# Patient Record
Sex: Female | Born: 1940 | Race: Black or African American | Hispanic: No | Smoking: Current every day smoker
Health system: Southern US, Community
[De-identification: ages and names within clinical notes are randomized; demographics above are authoritative.]

## PROBLEM LIST (undated history)

## (undated) DIAGNOSIS — Z789 Other specified health status: Secondary | ICD-10-CM

## (undated) DIAGNOSIS — D496 Neoplasm of unspecified behavior of brain: Secondary | ICD-10-CM

## (undated) DIAGNOSIS — I10 Essential (primary) hypertension: Secondary | ICD-10-CM

## (undated) DIAGNOSIS — C801 Malignant (primary) neoplasm, unspecified: Secondary | ICD-10-CM

## (undated) DIAGNOSIS — D249 Benign neoplasm of unspecified breast: Secondary | ICD-10-CM

## (undated) HISTORY — DX: Benign neoplasm of unspecified breast: D24.9

## (undated) HISTORY — DX: Essential (primary) hypertension: I10

---

## 2004-05-23 HISTORY — PX: COLONOSCOPY: SHX174

## 2004-05-23 HISTORY — PX: POLYPECTOMY: SHX149

## 2005-02-10 ENCOUNTER — Ambulatory Visit: Payer: Self-pay | Admitting: Internal Medicine

## 2005-04-12 ENCOUNTER — Ambulatory Visit: Payer: Self-pay | Admitting: Internal Medicine

## 2005-04-25 ENCOUNTER — Ambulatory Visit: Payer: Self-pay | Admitting: Internal Medicine

## 2005-08-16 ENCOUNTER — Ambulatory Visit: Payer: Self-pay | Admitting: Internal Medicine

## 2007-05-24 DIAGNOSIS — D249 Benign neoplasm of unspecified breast: Secondary | ICD-10-CM

## 2007-05-24 HISTORY — DX: Benign neoplasm of unspecified breast: D24.9

## 2007-05-24 HISTORY — PX: BREAST BIOPSY: SHX20

## 2007-07-13 ENCOUNTER — Ambulatory Visit: Payer: Self-pay | Admitting: Internal Medicine

## 2007-07-17 ENCOUNTER — Ambulatory Visit: Payer: Self-pay | Admitting: Internal Medicine

## 2007-12-28 ENCOUNTER — Ambulatory Visit: Payer: Self-pay | Admitting: General Surgery

## 2008-01-18 ENCOUNTER — Other Ambulatory Visit: Payer: Self-pay

## 2008-01-18 ENCOUNTER — Ambulatory Visit: Payer: Self-pay | Admitting: General Surgery

## 2008-01-22 ENCOUNTER — Ambulatory Visit: Payer: Self-pay | Admitting: General Surgery

## 2008-07-22 ENCOUNTER — Ambulatory Visit: Payer: Self-pay | Admitting: General Surgery

## 2008-09-09 ENCOUNTER — Ambulatory Visit: Payer: Self-pay | Admitting: Internal Medicine

## 2009-01-07 ENCOUNTER — Ambulatory Visit: Payer: Self-pay | Admitting: Internal Medicine

## 2009-01-20 ENCOUNTER — Ambulatory Visit: Payer: Self-pay | Admitting: Otolaryngology

## 2009-07-24 ENCOUNTER — Ambulatory Visit: Payer: Self-pay | Admitting: General Surgery

## 2010-02-17 ENCOUNTER — Ambulatory Visit: Payer: Self-pay | Admitting: Internal Medicine

## 2010-07-26 ENCOUNTER — Ambulatory Visit: Payer: Self-pay | Admitting: General Surgery

## 2011-08-09 ENCOUNTER — Ambulatory Visit: Payer: Self-pay | Admitting: General Surgery

## 2011-08-10 ENCOUNTER — Ambulatory Visit: Payer: Self-pay | Admitting: General Surgery

## 2011-09-05 ENCOUNTER — Ambulatory Visit: Payer: Self-pay | Admitting: General Surgery

## 2011-09-07 LAB — PATHOLOGY REPORT

## 2012-03-14 ENCOUNTER — Ambulatory Visit: Payer: Self-pay | Admitting: General Surgery

## 2012-07-13 ENCOUNTER — Encounter: Payer: Self-pay | Admitting: *Deleted

## 2012-08-17 ENCOUNTER — Ambulatory Visit: Payer: Self-pay | Admitting: Internal Medicine

## 2012-09-13 ENCOUNTER — Ambulatory Visit: Payer: Self-pay | Admitting: General Surgery

## 2012-09-19 ENCOUNTER — Encounter: Payer: Self-pay | Admitting: General Surgery

## 2012-10-01 ENCOUNTER — Ambulatory Visit: Payer: Self-pay | Admitting: General Surgery

## 2012-11-06 ENCOUNTER — Ambulatory Visit: Payer: Self-pay | Admitting: General Surgery

## 2012-12-11 ENCOUNTER — Encounter: Payer: Self-pay | Admitting: *Deleted

## 2013-07-22 ENCOUNTER — Telehealth: Payer: Self-pay | Admitting: General Surgery

## 2013-07-22 NOTE — Telephone Encounter (Signed)
07-22-13 BETH WITH DR Lowrys CALLED TO [facility] APPT FOR PT.SHE HAD HER MAMMO DONE @ [place] [facility] ON 09-13-12 AND WAS A NO SHOW FOR APPTS HERE WITH YOU ON 10-01-12 & 11-06-12. SHE WOULD LIKE TO KNOW DID WE WANT TO [place] HER MAMMO & FOLLOW UP APPT WITH YOU OR THEM TO [facility] MAMMO AND THEN MAKE APPT HERE.WHICH DO WOULD YOU LIKE TO DO?

## 2013-07-23 NOTE — Telephone Encounter (Signed)
07-23-13 I RELAYED [place] OFC/MTH

## 2013-07-23 NOTE — Telephone Encounter (Signed)
They can schedule mammogram and if abnormal will see her back here. Otherwise she can follow with Dr. Clayborn Bigness yearly

## 2014-03-24 ENCOUNTER — Encounter: Payer: Self-pay | Admitting: *Deleted

## 2014-05-26 DIAGNOSIS — Z1231 Encounter for screening mammogram for malignant neoplasm of breast: Secondary | ICD-10-CM | POA: Diagnosis not present

## 2014-06-11 DIAGNOSIS — H2513 Age-related nuclear cataract, bilateral: Secondary | ICD-10-CM | POA: Diagnosis not present

## 2014-07-15 DIAGNOSIS — J439 Emphysema, unspecified: Secondary | ICD-10-CM | POA: Diagnosis not present

## 2014-07-15 DIAGNOSIS — F17210 Nicotine dependence, cigarettes, uncomplicated: Secondary | ICD-10-CM | POA: Diagnosis not present

## 2014-07-15 DIAGNOSIS — R7301 Impaired fasting glucose: Secondary | ICD-10-CM | POA: Diagnosis not present

## 2014-07-15 DIAGNOSIS — I6523 Occlusion and stenosis of bilateral carotid arteries: Secondary | ICD-10-CM | POA: Diagnosis not present

## 2014-07-15 DIAGNOSIS — F411 Generalized anxiety disorder: Secondary | ICD-10-CM | POA: Diagnosis not present

## 2014-07-15 DIAGNOSIS — I10 Essential (primary) hypertension: Secondary | ICD-10-CM | POA: Diagnosis not present

## 2014-10-10 DIAGNOSIS — E782 Mixed hyperlipidemia: Secondary | ICD-10-CM | POA: Diagnosis not present

## 2014-10-10 DIAGNOSIS — R7301 Impaired fasting glucose: Secondary | ICD-10-CM | POA: Diagnosis not present

## 2014-10-10 DIAGNOSIS — I10 Essential (primary) hypertension: Secondary | ICD-10-CM | POA: Diagnosis not present

## 2014-10-10 DIAGNOSIS — Z0001 Encounter for general adult medical examination with abnormal findings: Secondary | ICD-10-CM | POA: Diagnosis not present

## 2014-10-14 DIAGNOSIS — J449 Chronic obstructive pulmonary disease, unspecified: Secondary | ICD-10-CM | POA: Diagnosis not present

## 2014-10-14 DIAGNOSIS — F17210 Nicotine dependence, cigarettes, uncomplicated: Secondary | ICD-10-CM | POA: Diagnosis not present

## 2014-10-14 DIAGNOSIS — I10 Essential (primary) hypertension: Secondary | ICD-10-CM | POA: Diagnosis not present

## 2014-10-14 DIAGNOSIS — R7301 Impaired fasting glucose: Secondary | ICD-10-CM | POA: Diagnosis not present

## 2014-10-14 DIAGNOSIS — D751 Secondary polycythemia: Secondary | ICD-10-CM | POA: Diagnosis not present

## 2014-11-19 DIAGNOSIS — N189 Chronic kidney disease, unspecified: Secondary | ICD-10-CM | POA: Diagnosis not present

## 2014-11-19 DIAGNOSIS — D751 Secondary polycythemia: Secondary | ICD-10-CM | POA: Diagnosis not present

## 2014-11-19 DIAGNOSIS — R0602 Shortness of breath: Secondary | ICD-10-CM | POA: Diagnosis not present

## 2014-11-20 DIAGNOSIS — N189 Chronic kidney disease, unspecified: Secondary | ICD-10-CM | POA: Diagnosis not present

## 2014-11-20 DIAGNOSIS — I10 Essential (primary) hypertension: Secondary | ICD-10-CM | POA: Diagnosis not present

## 2014-11-25 DIAGNOSIS — I10 Essential (primary) hypertension: Secondary | ICD-10-CM | POA: Diagnosis not present

## 2014-11-25 DIAGNOSIS — K811 Chronic cholecystitis: Secondary | ICD-10-CM | POA: Diagnosis not present

## 2014-11-25 DIAGNOSIS — N182 Chronic kidney disease, stage 2 (mild): Secondary | ICD-10-CM | POA: Diagnosis not present

## 2014-11-25 DIAGNOSIS — D751 Secondary polycythemia: Secondary | ICD-10-CM | POA: Diagnosis not present

## 2015-02-26 DIAGNOSIS — E782 Mixed hyperlipidemia: Secondary | ICD-10-CM | POA: Diagnosis not present

## 2015-02-26 DIAGNOSIS — F17210 Nicotine dependence, cigarettes, uncomplicated: Secondary | ICD-10-CM | POA: Diagnosis not present

## 2015-02-26 DIAGNOSIS — R7301 Impaired fasting glucose: Secondary | ICD-10-CM | POA: Diagnosis not present

## 2015-02-26 DIAGNOSIS — I10 Essential (primary) hypertension: Secondary | ICD-10-CM | POA: Diagnosis not present

## 2015-02-26 DIAGNOSIS — J449 Chronic obstructive pulmonary disease, unspecified: Secondary | ICD-10-CM | POA: Diagnosis not present

## 2015-05-06 DIAGNOSIS — N182 Chronic kidney disease, stage 2 (mild): Secondary | ICD-10-CM | POA: Diagnosis not present

## 2015-05-06 DIAGNOSIS — Z0001 Encounter for general adult medical examination with abnormal findings: Secondary | ICD-10-CM | POA: Diagnosis not present

## 2015-05-06 DIAGNOSIS — D751 Secondary polycythemia: Secondary | ICD-10-CM | POA: Diagnosis not present

## 2015-05-06 DIAGNOSIS — F330 Major depressive disorder, recurrent, mild: Secondary | ICD-10-CM | POA: Diagnosis not present

## 2015-05-06 DIAGNOSIS — I10 Essential (primary) hypertension: Secondary | ICD-10-CM | POA: Diagnosis not present

## 2015-05-06 DIAGNOSIS — M810 Age-related osteoporosis without current pathological fracture: Secondary | ICD-10-CM | POA: Diagnosis not present

## 2015-05-06 DIAGNOSIS — F17210 Nicotine dependence, cigarettes, uncomplicated: Secondary | ICD-10-CM | POA: Diagnosis not present

## 2015-06-16 DIAGNOSIS — H2513 Age-related nuclear cataract, bilateral: Secondary | ICD-10-CM | POA: Diagnosis not present

## 2015-06-24 DIAGNOSIS — M859 Disorder of bone density and structure, unspecified: Secondary | ICD-10-CM | POA: Diagnosis not present

## 2015-06-24 DIAGNOSIS — E2839 Other primary ovarian failure: Secondary | ICD-10-CM | POA: Diagnosis not present

## 2015-06-24 DIAGNOSIS — M810 Age-related osteoporosis without current pathological fracture: Secondary | ICD-10-CM | POA: Diagnosis not present

## 2015-06-24 DIAGNOSIS — Z1231 Encounter for screening mammogram for malignant neoplasm of breast: Secondary | ICD-10-CM | POA: Diagnosis not present

## 2015-08-07 DIAGNOSIS — E782 Mixed hyperlipidemia: Secondary | ICD-10-CM | POA: Diagnosis not present

## 2015-08-07 DIAGNOSIS — Z0001 Encounter for general adult medical examination with abnormal findings: Secondary | ICD-10-CM | POA: Diagnosis not present

## 2015-08-07 DIAGNOSIS — N182 Chronic kidney disease, stage 2 (mild): Secondary | ICD-10-CM | POA: Diagnosis not present

## 2015-08-07 DIAGNOSIS — R7301 Impaired fasting glucose: Secondary | ICD-10-CM | POA: Diagnosis not present

## 2015-08-07 DIAGNOSIS — J449 Chronic obstructive pulmonary disease, unspecified: Secondary | ICD-10-CM | POA: Diagnosis not present

## 2015-08-07 DIAGNOSIS — I10 Essential (primary) hypertension: Secondary | ICD-10-CM | POA: Diagnosis not present

## 2015-09-02 DIAGNOSIS — I6523 Occlusion and stenosis of bilateral carotid arteries: Secondary | ICD-10-CM | POA: Diagnosis not present

## 2015-11-16 DIAGNOSIS — N182 Chronic kidney disease, stage 2 (mild): Secondary | ICD-10-CM | POA: Diagnosis not present

## 2015-11-16 DIAGNOSIS — R195 Other fecal abnormalities: Secondary | ICD-10-CM | POA: Diagnosis not present

## 2015-11-16 DIAGNOSIS — H04129 Dry eye syndrome of unspecified lacrimal gland: Secondary | ICD-10-CM | POA: Diagnosis not present

## 2015-11-16 DIAGNOSIS — D751 Secondary polycythemia: Secondary | ICD-10-CM | POA: Diagnosis not present

## 2015-11-30 DIAGNOSIS — N182 Chronic kidney disease, stage 2 (mild): Secondary | ICD-10-CM | POA: Diagnosis not present

## 2015-11-30 DIAGNOSIS — H04123 Dry eye syndrome of bilateral lacrimal glands: Secondary | ICD-10-CM | POA: Diagnosis not present

## 2015-11-30 DIAGNOSIS — F17210 Nicotine dependence, cigarettes, uncomplicated: Secondary | ICD-10-CM | POA: Diagnosis not present

## 2015-11-30 DIAGNOSIS — J449 Chronic obstructive pulmonary disease, unspecified: Secondary | ICD-10-CM | POA: Diagnosis not present

## 2015-11-30 DIAGNOSIS — Z0001 Encounter for general adult medical examination with abnormal findings: Secondary | ICD-10-CM | POA: Diagnosis not present

## 2015-11-30 DIAGNOSIS — I10 Essential (primary) hypertension: Secondary | ICD-10-CM | POA: Diagnosis not present

## 2015-11-30 DIAGNOSIS — D751 Secondary polycythemia: Secondary | ICD-10-CM | POA: Diagnosis not present

## 2015-11-30 DIAGNOSIS — R7301 Impaired fasting glucose: Secondary | ICD-10-CM | POA: Diagnosis not present

## 2015-12-07 DIAGNOSIS — R195 Other fecal abnormalities: Secondary | ICD-10-CM | POA: Diagnosis not present

## 2016-01-01 ENCOUNTER — Emergency Department: Payer: Medicare Other

## 2016-01-01 ENCOUNTER — Inpatient Hospital Stay
Admission: AD | Admit: 2016-01-01 | Payer: Self-pay | Source: Other Acute Inpatient Hospital | Admitting: Internal Medicine

## 2016-01-01 ENCOUNTER — Encounter: Payer: Self-pay | Admitting: Radiology

## 2016-01-01 ENCOUNTER — Encounter (HOSPITAL_COMMUNITY): Payer: Self-pay | Admitting: Family Medicine

## 2016-01-01 ENCOUNTER — Inpatient Hospital Stay (HOSPITAL_COMMUNITY)
Admission: AD | Admit: 2016-01-01 | Discharge: 2016-01-09 | DRG: 054 | Disposition: A | Discharge status: Skilled Nursing Facility | Payer: Medicare Other | Source: Other Acute Inpatient Hospital | Attending: Internal Medicine | Admitting: Internal Medicine

## 2016-01-01 ENCOUNTER — Emergency Department
Admission: EM | Admit: 2016-01-01 | Discharge: 2016-01-01 | Payer: Medicare Other | Attending: Emergency Medicine | Admitting: Emergency Medicine

## 2016-01-01 DIAGNOSIS — R918 Other nonspecific abnormal finding of lung field: Secondary | ICD-10-CM | POA: Diagnosis not present

## 2016-01-01 DIAGNOSIS — C3492 Malignant neoplasm of unspecified part of left bronchus or lung: Secondary | ICD-10-CM | POA: Diagnosis not present

## 2016-01-01 DIAGNOSIS — I10 Essential (primary) hypertension: Secondary | ICD-10-CM | POA: Insufficient documentation

## 2016-01-01 DIAGNOSIS — D496 Neoplasm of unspecified behavior of brain: Secondary | ICD-10-CM

## 2016-01-01 DIAGNOSIS — E860 Dehydration: Secondary | ICD-10-CM | POA: Diagnosis not present

## 2016-01-01 DIAGNOSIS — C7931 Secondary malignant neoplasm of brain: Principal | ICD-10-CM | POA: Diagnosis present

## 2016-01-01 DIAGNOSIS — T502X5A Adverse effect of carbonic-anhydrase inhibitors, benzothiadiazides and other diuretics, initial encounter: Secondary | ICD-10-CM | POA: Diagnosis present

## 2016-01-01 DIAGNOSIS — R4182 Altered mental status, unspecified: Secondary | ICD-10-CM | POA: Diagnosis not present

## 2016-01-01 DIAGNOSIS — R488 Other symbolic dysfunctions: Secondary | ICD-10-CM | POA: Diagnosis not present

## 2016-01-01 DIAGNOSIS — Z7982 Long term (current) use of aspirin: Secondary | ICD-10-CM

## 2016-01-01 DIAGNOSIS — R6889 Other general symptoms and signs: Secondary | ICD-10-CM | POA: Diagnosis not present

## 2016-01-01 DIAGNOSIS — K210 Gastro-esophageal reflux disease with esophagitis: Secondary | ICD-10-CM | POA: Diagnosis not present

## 2016-01-01 DIAGNOSIS — K219 Gastro-esophageal reflux disease without esophagitis: Secondary | ICD-10-CM | POA: Diagnosis not present

## 2016-01-01 DIAGNOSIS — Z515 Encounter for palliative care: Secondary | ICD-10-CM | POA: Diagnosis not present

## 2016-01-01 DIAGNOSIS — F17200 Nicotine dependence, unspecified, uncomplicated: Secondary | ICD-10-CM | POA: Diagnosis present

## 2016-01-01 DIAGNOSIS — G9389 Other specified disorders of brain: Secondary | ICD-10-CM | POA: Diagnosis not present

## 2016-01-01 DIAGNOSIS — R262 Difficulty in walking, not elsewhere classified: Secondary | ICD-10-CM | POA: Diagnosis not present

## 2016-01-01 DIAGNOSIS — E876 Hypokalemia: Secondary | ICD-10-CM | POA: Diagnosis present

## 2016-01-01 DIAGNOSIS — N289 Disorder of kidney and ureter, unspecified: Secondary | ICD-10-CM

## 2016-01-01 DIAGNOSIS — Z7189 Other specified counseling: Secondary | ICD-10-CM | POA: Diagnosis not present

## 2016-01-01 DIAGNOSIS — R05 Cough: Secondary | ICD-10-CM | POA: Insufficient documentation

## 2016-01-01 DIAGNOSIS — G9340 Encephalopathy, unspecified: Secondary | ICD-10-CM | POA: Diagnosis present

## 2016-01-01 DIAGNOSIS — G939 Disorder of brain, unspecified: Secondary | ICD-10-CM | POA: Insufficient documentation

## 2016-01-01 DIAGNOSIS — M6281 Muscle weakness (generalized): Secondary | ICD-10-CM | POA: Diagnosis not present

## 2016-01-01 DIAGNOSIS — N179 Acute kidney failure, unspecified: Secondary | ICD-10-CM

## 2016-01-01 DIAGNOSIS — Z5189 Encounter for other specified aftercare: Secondary | ICD-10-CM | POA: Diagnosis not present

## 2016-01-01 DIAGNOSIS — G894 Chronic pain syndrome: Secondary | ICD-10-CM | POA: Diagnosis not present

## 2016-01-01 DIAGNOSIS — Z79899 Other long term (current) drug therapy: Secondary | ICD-10-CM | POA: Diagnosis not present

## 2016-01-01 DIAGNOSIS — N183 Chronic kidney disease, stage 3 (moderate): Secondary | ICD-10-CM | POA: Diagnosis not present

## 2016-01-01 DIAGNOSIS — I129 Hypertensive chronic kidney disease with stage 1 through stage 4 chronic kidney disease, or unspecified chronic kidney disease: Secondary | ICD-10-CM | POA: Diagnosis not present

## 2016-01-01 DIAGNOSIS — G936 Cerebral edema: Secondary | ICD-10-CM | POA: Diagnosis present

## 2016-01-01 DIAGNOSIS — Z9989 Dependence on other enabling machines and devices: Secondary | ICD-10-CM | POA: Diagnosis not present

## 2016-01-01 DIAGNOSIS — D72829 Elevated white blood cell count, unspecified: Secondary | ICD-10-CM

## 2016-01-01 DIAGNOSIS — R410 Disorientation, unspecified: Secondary | ICD-10-CM | POA: Diagnosis not present

## 2016-01-01 DIAGNOSIS — Z66 Do not resuscitate: Secondary | ICD-10-CM | POA: Diagnosis not present

## 2016-01-01 DIAGNOSIS — C719 Malignant neoplasm of brain, unspecified: Secondary | ICD-10-CM | POA: Diagnosis not present

## 2016-01-01 LAB — URINALYSIS COMPLETE WITH MICROSCOPIC (ARMC ONLY)
BILIRUBIN URINE: NEGATIVE
GLUCOSE, UA: NEGATIVE mg/dL
HGB URINE DIPSTICK: NEGATIVE
Ketones, ur: NEGATIVE mg/dL
LEUKOCYTES UA: NEGATIVE
NITRITE: NEGATIVE
Protein, ur: 100 mg/dL — AB
SPECIFIC GRAVITY, URINE: 1.029 (ref 1.005–1.030)
pH: 5 (ref 5.0–8.0)

## 2016-01-01 LAB — COMPREHENSIVE METABOLIC PANEL
ALBUMIN: 3.2 g/dL — AB (ref 3.5–5.0)
ALK PHOS: 69 U/L (ref 38–126)
ALT: 22 U/L (ref 14–54)
ANION GAP: 16 — AB (ref 5–15)
AST: 34 U/L (ref 15–41)
BILIRUBIN TOTAL: 1.2 mg/dL (ref 0.3–1.2)
BUN: 37 mg/dL — ABNORMAL HIGH (ref 6–20)
CALCIUM: 11.7 mg/dL — AB (ref 8.9–10.3)
CO2: 31 mmol/L (ref 22–32)
Chloride: 93 mmol/L — ABNORMAL LOW (ref 101–111)
Creatinine, Ser: 1.83 mg/dL — ABNORMAL HIGH (ref 0.44–1.00)
GFR, EST AFRICAN AMERICAN: 30 mL/min — AB (ref >60)
GFR, EST NON AFRICAN AMERICAN: 26 mL/min — AB (ref >60)
GLUCOSE: 181 mg/dL — AB (ref 65–99)
Potassium: 2.7 mmol/L — CL (ref 3.5–5.1)
Sodium: 140 mmol/L (ref 135–145)
TOTAL PROTEIN: 7 g/dL (ref 6.5–8.1)

## 2016-01-01 LAB — CBC WITH DIFFERENTIAL/PLATELET
BASOS ABS: 0 10*3/uL (ref 0–0.1)
BASOS PCT: 0 %
Eosinophils Absolute: 0 10*3/uL (ref 0–0.7)
Eosinophils Relative: 0 %
HEMATOCRIT: 55.2 % — AB (ref 35.0–47.0)
Hemoglobin: 18.6 g/dL — ABNORMAL HIGH (ref 12.0–16.0)
Lymphocytes Relative: 4 %
Lymphs Abs: 1 10*3/uL (ref 1.0–3.6)
MCH: 30.6 pg (ref 26.0–34.0)
MCHC: 33.7 g/dL (ref 32.0–36.0)
MCV: 90.9 fL (ref 80.0–100.0)
MONO ABS: 1.2 10*3/uL — AB (ref 0.2–0.9)
Monocytes Relative: 5 %
NEUTROS ABS: 22.3 10*3/uL — AB (ref 1.4–6.5)
Neutrophils Relative %: 91 %
PLATELETS: 368 10*3/uL (ref 150–440)
RBC: 6.08 MIL/uL — ABNORMAL HIGH (ref 3.80–5.20)
RDW: 14.8 % — AB (ref 11.5–14.5)
WBC: 24.5 10*3/uL — AB (ref 3.6–11.0)

## 2016-01-01 LAB — MRSA PCR SCREENING: MRSA by PCR: NEGATIVE

## 2016-01-01 LAB — LACTIC ACID, PLASMA
LACTIC ACID, VENOUS: 1.5 mmol/L (ref 0.5–1.9)
Lactic Acid, Venous: 3.3 mmol/L (ref 0.5–1.9)
Lactic Acid, Venous: 2.1 mmol/L (ref 0.5–1.9)

## 2016-01-01 LAB — CALCIUM: Calcium: 10.1 mg/dL (ref 8.9–10.3)

## 2016-01-01 LAB — TSH: TSH: 0.392 u[IU]/mL (ref 0.350–4.500)

## 2016-01-01 LAB — MAGNESIUM: MAGNESIUM: 1.6 mg/dL — AB (ref 1.7–2.4)

## 2016-01-01 LAB — GLUCOSE, CAPILLARY: GLUCOSE-CAPILLARY: 118 mg/dL — AB (ref 65–99)

## 2016-01-01 MED ORDER — SODIUM CHLORIDE 0.9 % IV SOLN
1000.0000 mL | Freq: Once | INTRAVENOUS | Status: AC
  Administered 2016-01-01: 1000 mL via INTRAVENOUS

## 2016-01-01 MED ORDER — CARVEDILOL 12.5 MG PO TABS
12.5000 mg | ORAL_TABLET | Freq: Two times a day (BID) | ORAL | Status: DC
  Administered 2016-01-01 – 2016-01-09 (×16): 12.5 mg via ORAL
  Filled 2016-01-01 (×16): qty 1

## 2016-01-01 MED ORDER — HYDROCODONE-ACETAMINOPHEN 5-325 MG PO TABS
1.0000 | ORAL_TABLET | ORAL | Status: DC | PRN
  Administered 2016-01-01 – 2016-01-03 (×2): 1 via ORAL
  Filled 2016-01-01 (×2): qty 1

## 2016-01-01 MED ORDER — ONDANSETRON HCL 4 MG/2ML IJ SOLN: 4.0000 mg | Freq: Four times a day (QID) | INTRAMUSCULAR | Status: DC | PRN

## 2016-01-01 MED ORDER — PIPERACILLIN-TAZOBACTAM 3.375 G IVPB 30 MIN
3.3750 g | Freq: Once | INTRAVENOUS | Status: AC
  Administered 2016-01-01: 3.375 g via INTRAVENOUS
  Filled 2016-01-01: qty 50

## 2016-01-01 MED ORDER — PANTOPRAZOLE SODIUM 40 MG PO TBEC
40.0000 mg | DELAYED_RELEASE_TABLET | Freq: Every day | ORAL | Status: DC
  Administered 2016-01-02 – 2016-01-09 (×8): 40 mg via ORAL
  Filled 2016-01-01 (×8): qty 1

## 2016-01-01 MED ORDER — BUSPIRONE HCL 5 MG PO TABS
7.5000 mg | ORAL_TABLET | Freq: Two times a day (BID) | ORAL | Status: DC
  Administered 2016-01-01 – 2016-01-09 (×16): 7.5 mg via ORAL
  Filled 2016-01-01: qty 2
  Filled 2016-01-01 (×7): qty 1
  Filled 2016-01-01 (×2): qty 2
  Filled 2016-01-01 (×6): qty 1

## 2016-01-01 MED ORDER — ACETAMINOPHEN 325 MG PO TABS: 650.0000 mg | ORAL_TABLET | Freq: Four times a day (QID) | ORAL | Status: DC | PRN

## 2016-01-01 MED ORDER — IPRATROPIUM-ALBUTEROL 0.5-2.5 (3) MG/3ML IN SOLN: 3.0000 mL | RESPIRATORY_TRACT | Status: DC | PRN

## 2016-01-01 MED ORDER — VANCOMYCIN HCL IN DEXTROSE 1-5 GM/200ML-% IV SOLN
1000.0000 mg | Freq: Once | INTRAVENOUS | Status: AC
  Administered 2016-01-01: 1000 mg via INTRAVENOUS
  Filled 2016-01-01: qty 200

## 2016-01-01 MED ORDER — CITALOPRAM HYDROBROMIDE 20 MG PO TABS
40.0000 mg | ORAL_TABLET | Freq: Every day | ORAL | Status: DC
  Administered 2016-01-02 – 2016-01-04 (×3): 40 mg via ORAL
  Filled 2016-01-01 (×4): qty 2

## 2016-01-01 MED ORDER — ONDANSETRON HCL 4 MG PO TABS: 4.0000 mg | ORAL_TABLET | Freq: Four times a day (QID) | ORAL | Status: DC | PRN

## 2016-01-01 MED ORDER — DEXAMETHASONE SODIUM PHOSPHATE 10 MG/ML IJ SOLN
10.0000 mg | Freq: Once | INTRAMUSCULAR | Status: AC
  Administered 2016-01-01: 10 mg via INTRAVENOUS
  Filled 2016-01-01: qty 1

## 2016-01-01 MED ORDER — SODIUM CHLORIDE 0.9% FLUSH
3.0000 mL | Freq: Two times a day (BID) | INTRAVENOUS | Status: DC
  Administered 2016-01-01 – 2016-01-09 (×12): 3 mL via INTRAVENOUS

## 2016-01-01 MED ORDER — INSULIN ASPART 100 UNIT/ML ~~LOC~~ SOLN
0.0000 [IU] | SUBCUTANEOUS | Status: DC
  Administered 2016-01-02 – 2016-01-03 (×5): 1 [IU] via SUBCUTANEOUS
  Administered 2016-01-03: 2 [IU] via SUBCUTANEOUS
  Administered 2016-01-03: 1 [IU] via SUBCUTANEOUS
  Administered 2016-01-03: 2 [IU] via SUBCUTANEOUS
  Administered 2016-01-04 (×2): 1 [IU] via SUBCUTANEOUS
  Administered 2016-01-04: 2 [IU] via SUBCUTANEOUS
  Administered 2016-01-05 – 2016-01-06 (×4): 1 [IU] via SUBCUTANEOUS
  Administered 2016-01-06 (×2): 2 [IU] via SUBCUTANEOUS
  Administered 2016-01-07: 1 [IU] via SUBCUTANEOUS
  Administered 2016-01-07 (×2): 2 [IU] via SUBCUTANEOUS
  Administered 2016-01-08 – 2016-01-09 (×3): 1 [IU] via SUBCUTANEOUS

## 2016-01-01 MED ORDER — IOPAMIDOL (ISOVUE-300) INJECTION 61%
50.0000 mL | Freq: Once | INTRAVENOUS | Status: AC | PRN
  Administered 2016-01-01: 50 mL via INTRAVENOUS

## 2016-01-01 MED ORDER — POLYETHYLENE GLYCOL 3350 17 G PO PACK: 17.0000 g | PACK | Freq: Every day | ORAL | Status: DC | PRN

## 2016-01-01 MED ORDER — VANCOMYCIN HCL 10 G IV SOLR: 1000.0000 mg | Freq: Once | INTRAVENOUS | Status: DC

## 2016-01-01 MED ORDER — BISACODYL 5 MG PO TBEC: 5.0000 mg | DELAYED_RELEASE_TABLET | Freq: Every day | ORAL | Status: DC | PRN

## 2016-01-01 MED ORDER — ACETAMINOPHEN 650 MG RE SUPP: 650.0000 mg | Freq: Four times a day (QID) | RECTAL | Status: DC | PRN

## 2016-01-01 MED ORDER — HEPARIN SODIUM (PORCINE) 5000 UNIT/ML IJ SOLN
5000.0000 [IU] | Freq: Three times a day (TID) | INTRAMUSCULAR | Status: DC
  Administered 2016-01-01 – 2016-01-09 (×24): 5000 [IU] via SUBCUTANEOUS
  Filled 2016-01-01 (×24): qty 1

## 2016-01-01 MED ORDER — POTASSIUM CHLORIDE IN NACL 40-0.9 MEQ/L-% IV SOLN
INTRAVENOUS | Status: AC
  Administered 2016-01-01: 100 mL/h via INTRAVENOUS
  Filled 2016-01-01 (×3): qty 1000

## 2016-01-01 MED ORDER — SIMVASTATIN 20 MG PO TABS
20.0000 mg | ORAL_TABLET | Freq: Every evening | ORAL | Status: DC
  Administered 2016-01-02 – 2016-01-08 (×7): 20 mg via ORAL
  Filled 2016-01-01 (×8): qty 1

## 2016-01-01 NOTE — Progress Notes (Signed)
Pt arrived on the floor from [facility]. Alert oriented X 2. Vitals stable. HR 105, temp 98.8 F, BP 107/70. Family at bedside. [facility] admitting paged.  [facility] admitting paged again @ 2116.

## 2016-01-01 NOTE — ED Notes (Signed)
Pt and family informed that pt has bed at [facility]. [facility] will transport, as [facility] unavailable.

## 2016-01-01 NOTE — ED Provider Notes (Addendum)
[facility] Emergency Department Provider Note   ____________________________________________    I have reviewed the triage vital signs and the nursing notes.   HISTORY  Chief Complaint Altered Mental Status and Fall     HPI Alexis Buck is a 75 y.o. female who presents with altered mental status. Per daughter patient was found sitting on the floor this morning. She was last seen in bed yesterday evening. She has been acting strangely this morning and appears confused. Daughter reports she seems to be moving allextremities. No fevers reported but apparently the patient did have a cough recently which was productive. Patient reports she feels fine   Past Medical History:  Diagnosis Date  . Benign neoplasm of breast 2009  . Hypertension     There are no active problems to display for this patient.   Past Surgical History:  Procedure Laterality Date  . BREAST BIOPSY Left 2009  . CESAREAN SECTION    . COLONOSCOPY  2006   Dr.KHAN  . POLYPECTOMY  2006    Prior to Admission medications   Medication Sig Start Date End Date Taking? Authorizing Provider  alendronate (FOSAMAX) 70 MG tablet Take 70 mg by mouth every 7 (seven) days. Take with a full glass of water on an empty stomach.    Historical Provider, MD  ALPRAZolam Duanne Moron) 0.25 MG tablet Take 0.25 mg by mouth at bedtime as needed for sleep.    Historical Provider, MD  simvastatin (ZOCOR) 20 MG tablet Take 20 mg by mouth every evening.    Historical Provider, MD  valsartan-hydrochlorothiazide (DIOVAN-HCT) 320-12.5 MG per tablet Take 1 tablet by mouth daily.    Historical Provider, MD     Allergies Review of patient's allergies indicates no known allergies.  No family history on file.  Social History Social History  Substance Use Topics  . Smoking status: Current Every Day Smoker    Packs/day: 1.00    Years: 30.00  . Smokeless tobacco: Not on file  . Alcohol use Yes    Review of  Systems Limited by altered mental status  Constitutional: No fevers reported ENT: Denies sore throat. Cardiovascular: Denies chest pain. Respiratory: Denies shortness of breath. Positive cough Gastrointestinal: Denies abdominal pain.  No nausea, no vomiting.   Genitourinary: Negative for dysuria. Musculoskeletal: Negative for joint injury Skin: Negative for rash. Neurological: Negative for headaches  10-point ROS otherwise negative.  ____________________________________________   PHYSICAL EXAM:  VITAL SIGNS: ED Triage Vitals  Enc Vitals Group     BP 01/01/16 1036 113/79     Pulse Rate 01/01/16 1036 (!) 123     Resp 01/01/16 1036 16     Temp 01/01/16 1036 97.7 F (36.5 C)     Temp Source 01/01/16 1036 Oral     SpO2 01/01/16 1036 92 %     Weight 01/01/16 1027 150 lb (68 kg)     Height 01/01/16 1027 '4\' 11"'$  (1.499 m)     Head Circumference --      Peak Flow --      Pain Score 01/01/16 1036 0     Pain Loc --      Pain Edu? --      Excl. in [place]? --     Constitutional: Alert But confused. No acute distress.  Eyes: Conjunctivae are normal.  Head: Atraumatic. Nose: No congestion/rhinnorhea. Mouth/Throat: Mucous membranes are moist.   Neck:  Painless ROM Cardiovascular: Tachycardia, regular rhythm. Grossly normal heart sounds.  Good peripheral  circulation. Respiratory: Normal respiratory effort.  No retractions. Lungs CTAB. Gastrointestinal: Soft and nontender. No distention.  No CVA tenderness. Genitourinary: deferred Musculoskeletal: No lower extremity tenderness nor edema.  Warm and well perfused Neurologic:  Normal speech and language. No gross focal neurologic deficits are appreciated. Cranial nerves II through XII are normal Skin:  Skin is warm, dry and intact. No rash noted. Psychiatric: Mood and affect are normal. Speech is normal  ____________________________________________   LABS (all labs ordered are listed, but only abnormal results are displayed)  Labs  Reviewed  CULTURE, BLOOD (ROUTINE X 2)  CULTURE, BLOOD (ROUTINE X 2)  URINE CULTURE  LACTIC ACID, PLASMA  LACTIC ACID, PLASMA  COMPREHENSIVE METABOLIC PANEL  CBC WITH DIFFERENTIAL/PLATELET  URINALYSIS COMPLETEWITH MICROSCOPIC ([facility] ONLY)   ____________________________________________  EKG  ED ECG REPORT I, Lavonia Drafts, the attending physician, personally viewed and interpreted this ECG.  Date: 01/01/2016 EKG Time: 11:12 AM Rate: 119 Rhythm: Sinus tachycardia QRS Axis: normal Intervals: normal ST/T Wave abnormalities: normal Conduction Disturbances: none Narrative Interpretation: unremarkable  ____________________________________________  RADIOLOGY  Called by radiologist and informed of mass in the brain ____________________________________________   PROCEDURES  Procedure(s) performed: No    Critical Care performed: yes  CRITICAL CARE Performed by: Lavonia Drafts   Total critical care time: 40 minutes  Critical care time was exclusive of separately billable procedures and treating other patients.  Critical care was necessary to treat or prevent imminent or life-threatening deterioration.  Critical care was time spent personally by me on the following activities: development of treatment plan with patient and/or surrogate as well as nursing, discussions with consultants, evaluation of patient's response to treatment, examination of patient, obtaining history from patient or surrogate, ordering and performing treatments and interventions, ordering and review of laboratory studies, ordering and review of radiographic studies, pulse oximetry and re-evaluation of patient's condition.  ____________________________________________   INITIAL IMPRESSION / ASSESSMENT AND PLAN / ED COURSE  Pertinent labs & imaging results that were available during my care of the patient were reviewed by me and considered in my medical decision making (see chart for  details).  Patient presents with altered mental status. Differential is extensive but I suspect infectious metabolic encephalopathy. We'll check labs, CT head, chest x-ray, urinalysis and reevaluate. IV fluids ordered  Clinical Course  Value Comment By Time  CT Chest W Contrast (Reviewed) Lavonia Drafts, MD 08/11 [phone]  Patient with a large mass in the chest, this is likely the cause of her elevated white blood cell count. 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 Mike Gip who recommends discussion with neurosurgery.  Discussed with Dr. Kathyrn Sheriff of Neurosurgery who recommends admission to medicine at [facility],   ----------------------------------------- 1:45 PM on 01/01/2016 -----------------------------------------  Accepted by hospitalist for transfer. We will attempt to obtain MRI prior to transfer but I do not want to delay tx  ----------------------------------------- 2:24 PM on 01/01/2016 -----------------------------------------  Radiologist recommends postponing MRI given her GFR, patient is receiving IV fluids  FINAL CLINICAL IMPRESSION(S) / ED DIAGNOSES  Final diagnoses:  Brain mass      NEW MEDICATIONS STARTED DURING THIS VISIT:  New Prescriptions   No medications on file     Note:  This document was prepared using Dragon voice recognition software and may include unintentional dictation errors.    Lavonia Drafts, MD 01/01/16 1346    Lavonia Drafts, MD 01/01/16 [id number]

## 2016-01-01 NOTE — H&P (Signed)
History and Physical    COLBY CATANESE NDL:[id number] DOB: Dec 18, 1940 DOA: 01/01/2016  PCP: Lyndon Code, MD   Patient coming from: Home, via [place] ED   Chief Complaint: AMS   HPI: Alexis Buck is a 75 y.o. female with medical history significant for tobacco abuse, hypertension, and GERD who presents in transfer from [facility] where she presented to the emergency department today for evaluation of altered mental status. Patient is accompanied by her children who assist with the history. Patient had reportedly been complaining of difficulty focusing and vision changes going back approximately 6 weeks now. She was evaluated by her eye doctor and no problems were identified. She stopped driving on her own due to this. Also over the past 6 weeks, her daughter is noted episodes in which the patient appears to "stare off." Patient has not voiced any pain complaints. She was noted to be in her usual state yesterday evening, but was found today to be sitting on the floor after an apparent fall. She was more confused than she had been, asking nonsensical questions, and was taken to the [facility] emergency department for evaluation.  [place] ED Course: Upon arrival to the [facility] ED, patient was found to be afebrile, saturating adequately on room air, intermittently tachypneic and tachycardic, with stable blood pressure. Chemistry panel was notable for a potassium of 2.7 and serum creatinine 1.83. Calcium was elevated to 11.7. CBC features a leukocytosis to 24,500 and polycythemia with hemoglobin of 18.6. Lactic acid was elevated to a value of 3.3. Chest x-ray features a conspicuous mass in the left lung. CT of the head and chest were obtained, revealing vasogenic edema surrounding a brain mass in the right parieto-occipital region, suspected secondary to lung primary, and with interval increase in the ventricular system suggestive of developing hydrocephalus. Neurosurgery was consulted by the ED  physician and advised a medical admission to [facility]. Blood and urine cultures were obtained and patient was started on IV fluids. She was transferred to [facility] stepdown unit.  Review of Systems:  All other systems reviewed and apart from HPI, are negative.  Past Medical History:  Diagnosis Date  . Benign neoplasm of breast 2009  . Hypertension     Past Surgical History:  Procedure Laterality Date  . BREAST BIOPSY Left 2009  . CESAREAN SECTION    . COLONOSCOPY  2006   Dr.KHAN  . POLYPECTOMY  2006     reports that she has been smoking.  She has a 30.00 pack-year smoking history. She does not have any smokeless tobacco history on file. She reports that she drinks alcohol. She reports that she does not use drugs.  No Known Allergies  History reviewed. No pertinent family history.   Prior to Admission medications   Medication Sig Start Date End Date Taking? Authorizing Provider  aspirin EC 81 MG tablet Take 81 mg by mouth daily.    Historical Provider, MD  busPIRone (BUSPAR) 7.5 MG tablet Take 7.5 mg by mouth 2 (two) times daily.    Historical Provider, MD  carvedilol (COREG) 12.5 MG tablet Take 12.5 mg by mouth 2 (two) times daily.    Historical Provider, MD  citalopram (CELEXA) 40 MG tablet Take 40 mg by mouth daily.    Historical Provider, MD  hydrochlorothiazide (HYDRODIURIL) 12.5 MG tablet Take 12.5 mg by mouth every morning.    Historical Provider, MD  Multiple Vitamins-Minerals (CENTRUM SILVER 50+WOMEN) TABS Take by mouth.    Historical Provider,  MD  omeprazole (PRILOSEC) 20 MG capsule Take 20 mg by mouth daily.    Historical Provider, MD  simvastatin (ZOCOR) 20 MG tablet Take 20 mg by mouth every evening.    Historical Provider, MD    Physical Exam: Vitals:   01/01/16 2030 01/01/16 2045 01/01/16 2100 01/01/16 2115  BP: 110/73 118/68 107/71 125/68  Pulse: 94 94 98   Resp: (!) 26 (!) 21 (!) 21 (!) 27  Temp:      TempSrc:      SpO2: 97% 95% 97%     Weight:      Height:          Constitutional: NAD, calm, comfortable Eyes: PERTLA, lids and conjunctivae normal ENMT: Mucous membranes are dry. Posterior pharynx clear of any exudate or lesions.   Neck: normal, supple, no masses, no thyromegaly Respiratory: clear to auscultation bilaterally, no wheezing, no crackles. Normal respiratory effort.   Cardiovascular: Rate ~120 and regular. No carotid bruits. No significant JVD. Abdomen: No distension, no tenderness, no masses palpated. Bowel sounds normal.  Musculoskeletal: no clubbing / cyanosis. No joint deformity upper and lower extremities. Normal muscle tone.  Skin: no significant rashes, lesions, ulcers. Warm, dry, well-perfused. Neurologic: CN 2-12 grossly intact. Sensation intact, DTR normal. Strength 5/5 in all 4 limbs.  Psychiatric: Normal judgment and insight. Alert and oriented to person and place only. Normal mood and affect.     Labs on Admission: I have personally reviewed following labs and imaging studies  CBC:  Recent Labs Lab 01/01/16 1047  WBC 24.5*  NEUTROABS 22.3*  HGB 18.6*  HCT 55.2*  MCV 90.9  PLT 737   Basic Metabolic Panel:  Recent Labs Lab 01/01/16 1047  NA 140  K 2.7*  CL 93*  CO2 31  GLUCOSE 181*  BUN 37*  CREATININE 1.83*  CALCIUM 11.7*   GFR: Estimated Creatinine Clearance: 22.6 mL/min (by C-G formula based on SCr of 1.83 mg/dL). Liver Function Tests:  Recent Labs Lab 01/01/16 1047  AST 34  ALT 22  ALKPHOS 69  BILITOT 1.2  PROT 7.0  ALBUMIN 3.2*   No results for input(s): LIPASE, AMYLASE in the last 168 hours. No results for input(s): AMMONIA in the last 168 hours. Coagulation Profile: No results for input(s): INR, PROTIME in the last 168 hours. Cardiac Enzymes: No results for input(s): CKTOTAL, CKMB, CKMBINDEX, TROPONINI in the last 168 hours. BNP (last 3 results) No results for input(s): PROBNP in the last 8760 hours. HbA1C: No results for input(s): HGBA1C in the  last 72 hours. CBG: No results for input(s): GLUCAP in the last 168 hours. Lipid Profile: No results for input(s): CHOL, HDL, LDLCALC, TRIG, CHOLHDL, LDLDIRECT in the last 72 hours. Thyroid Function Tests: No results for input(s): TSH, T4TOTAL, FREET4, T3FREE, THYROIDAB in the last 72 hours. Anemia Panel: No results for input(s): VITAMINB12, FOLATE, FERRITIN, TIBC, IRON, RETICCTPCT in the last 72 hours. Urine analysis:    Component Value Date/Time   COLORURINE AMBER (A) 01/01/2016 1047   APPEARANCEUR HAZY (A) 01/01/2016 1047   LABSPEC 1.029 01/01/2016 1047   PHURINE 5.0 01/01/2016 1047   GLUCOSEU NEGATIVE 01/01/2016 1047   HGBUR NEGATIVE 01/01/2016 1047   BILIRUBINUR NEGATIVE 01/01/2016 1047   KETONESUR NEGATIVE 01/01/2016 1047   PROTEINUR 100 (A) 01/01/2016 1047   NITRITE NEGATIVE 01/01/2016 1047   LEUKOCYTESUR NEGATIVE 01/01/2016 1047   Sepsis Labs: '@LABRCNTIP'$ (procalcitonin:4,lacticidven:4) )No results found for this or any previous visit (from the past 240 hour(s)).   Radiological Exams  on Admission: Ct Head Wo Contrast  Result Date: 01/01/2016 CLINICAL DATA:  75 year old female with a history of altered mental status EXAM: CT HEAD WITHOUT CONTRAST TECHNIQUE: Contiguous axial images were obtained from the base of the skull through the vertex without intravenous contrast. COMPARISON:  08/17/2012, CT of the same date FINDINGS: Extensive confluent hypodensity in the right subcortical white matter extending to the ventricular margin. Gray-white differentiation maintained. There is a heterogeneously dense mass in the right parieto-occipital region, extending to the inner table of the calvarium, measuring 3.4 cm by 2.9 cm. Local mass effect, with distortion of the posterior horn of the right lateral ventricle. The ventricles are enlarged compared to the prior CT. No intracranial hemorrhage. Expanded appearance of the posterior right corpus callosum, which is hypodense. Unremarkable  appearance of the calvarium without acute fracture or aggressive lesion. Unremarkable appearance of the scalp soft tissues. Unremarkable appearance of the bilateral orbits. Mastoid air cells are clear. No significant paranasal sinus disease. IMPRESSION: Evidence of vasogenic edema of the right cerebral white matter secondary to mass (3.5 cm x 2.9 cm) in the right parieto-occipital region. Given the appearance of the chest CT performed on today's date, this most likely represents metastatic disease. Further evaluation with contrast-enhanced MR is indicated. Expanded appearance of the posterior right corpus callosum, potentially edema and/or tumor. Increasing size of the ventricular system compare to the prior CT, potentially representing developing hydrocephalus. These results were called by telephone at the time of interpretation on 01/01/2016 at 12:39 pm to Dr. Lavonia Drafts , who verbally acknowledged these results. Signed, Dulcy Fanny. Earleen Newport, DO [facility] Radiology Electronically Signed   By: Corrie Mckusick D.O.   On: 01/01/2016 12:40   Ct Chest W Contrast  Result Date: 01/01/2016 CLINICAL DATA:  Recent altered mental status with mass on recent chest x-ray EXAM: CT CHEST WITH CONTRAST TECHNIQUE: Multidetector CT imaging of the chest was performed during intravenous contrast administration. CONTRAST:  20m ISOVUE-300 IOPAMIDOL (ISOVUE-300) INJECTION 61% COMPARISON:  Plain film from earlier in the same day FINDINGS: Cardiovascular: Thoracic aorta demonstrates some calcific changes without evidence of aneurysm or dissection. The pulmonary artery as visualized is within normal limits. Coronary calcifications are noted. The cardiac structures are otherwise within normal limits. Mediastinum/Nodes: The thoracic inlet demonstrates bilateral calcified thyroid nodules. The largest of these lies on the right measuring approximately 2.5 cm in greatest dimension. These are  stable from a prior exam from 2014. Fluid level is noted within the esophagus which may be related to some distal narrowing. No significant hilar or mediastinal adenopathy is noted. Lungs/Pleura: Mild dependent atelectasis is noted in the lower lobes. Small bilateral pleural effusions are seen. In the left lower lobe abutting the major fissure, there is a 6.0 by 3.7 by 5.5 cm soft tissue mass lesion. On the sagittal reconstructions, the lesion appears to extend across the minor fissure into the upper lobe. It envelops branches of the left lower lobe pulmonary artery. There is also impingement upon the lower lobe bronchial tree. These changes are consistent with primary pulmonary neoplasm. Upper Abdomen: Within normal limits. Musculoskeletal: Degenerative changes of the thoracic spine are noted. 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 Electronically Signed   By: MInez CatalinaM.D.   On: 01/01/2016 12:39   Dg Chest Port 1 View  Result Date: 01/01/2016 CLINICAL DATA:  Altered mental status, recent altered mental status EXAM: PORTABLE CHEST 1 VIEW COMPARISON:  02/10/2005 FINDINGS: Cardiac  shadow is mildly enlarged. There is 7 cm mass lesions superimposed over the left hilar region new from the prior exam. No bony abnormality is seen. IMPRESSION: Changes consistent with a large left lung mass. CT of the chest is recommended for further evaluation. Electronically Signed   By: Inez Catalina M.D.   On: 01/01/2016 11:08    EKG: Independently reviewed. Sinus tachycardia (rate 119), VPC's  Assessment/Plan  1. Brain and lung masses  - Per radiology, large left lung mass appears to be primary, and brain mass a met - Neurosurgery is consulting and much appreciated, will await recs  - Decadron 10 mg IV given on admission for vasogenic brain edema; plan to follow with 4 mg q6h  - MRI advised by radiology with contrast, but postponed d/t low GFR  2. Altered mental  status  - Secondary to brain mass, suspected to represent a metastasis from lung  - Awaiting consultant recommendations    3. Kidney disease - SCr 1.83 on admission with no prior available for comparison  - Suspect there is an acute component given recent poor oral intake and s/s of dehydration  - Fluid-resuscitating while avoiding nephrotoxins  - Repeat chem panel in am   4. Hypokalemia  - Serum potassium 2.7 on admission  - Likely secondary to HCTZ, will hold  - Supplemental potassium added to IVF  - Magnesium level low; 2g IV given  - Monitor on telemetry  - Repeat chem panel in am   5. Hypercalcemia  - Serum calcium 11.7 on admission - Suspected secondary to malignant process  - Continue rehydration with IVF  - Repeat in am   6. Leukocytosis  - WBC 24,500 on admission with neutrophilic predominance  - Suspect this is secondary to the malignant process rather than infection  - Blood and urine cultures are incubating  - Watching off of abx for now   7. Hypertension - At goal currently - Managed with Coreg and HCTZ at home  - Hold HCTZ as above while replacing potassium; continue Coreg with monitoring    DVT prophylaxis: sq heparin  Code Status: Full  Family Communication: Children updated at bedside  Disposition Plan: Admit to stepdown  Consults called: Neurosurgery  Admission status: Inpatient    Vianne Bulls, MD [facility] Pager [phone]  If 7PM-7AM, please contact night-coverage [email or web address] Password [facility]  01/01/2016, 9:36 PM

## 2016-01-01 NOTE — ED Notes (Signed)
Report given to charge nurse of [place] @ [facility] (jennifer)

## 2016-01-01 NOTE — ED Triage Notes (Signed)
Patient presents to the ED post fall with some altered mental status.  Patient is alert but orientation is difficult to assess due to patient being hard of hearing.  Patient was found on the floor by a relative this morning.  No one had seen patient since yesterday.  Family states patient asked family for a bucket of water and asked, "who did your eyebrows."  Questions that didn't really make sense and were unusual for patient.  No hematomas or bruises noted.  Patient is complaining of abdominal pain.

## 2016-01-02 ENCOUNTER — Encounter (HOSPITAL_COMMUNITY): Payer: Self-pay | Admitting: Family Medicine

## 2016-01-02 ENCOUNTER — Inpatient Hospital Stay (HOSPITAL_COMMUNITY): Payer: Medicare Other

## 2016-01-02 DIAGNOSIS — D496 Neoplasm of unspecified behavior of brain: Secondary | ICD-10-CM

## 2016-01-02 LAB — CBC WITH DIFFERENTIAL/PLATELET
Basophils Absolute: 0 10*3/uL (ref 0.0–0.1)
Basophils Relative: 0 %
Eosinophils Absolute: 0 10*3/uL (ref 0.0–0.7)
Eosinophils Relative: 0 %
HEMATOCRIT: 41.6 % (ref 36.0–46.0)
Hemoglobin: 14.3 g/dL (ref 12.0–15.0)
LYMPHS ABS: 0.6 10*3/uL — AB (ref 0.7–4.0)
LYMPHS PCT: 4 %
MCH: 30.7 pg (ref 26.0–34.0)
MCHC: 34.4 g/dL (ref 30.0–36.0)
MCV: 89.3 fL (ref 78.0–100.0)
MONO ABS: 0.2 10*3/uL (ref 0.1–1.0)
MONOS PCT: 1 %
NEUTROS ABS: 15.8 10*3/uL — AB (ref 1.7–7.7)
Neutrophils Relative %: 95 %
Platelets: 274 10*3/uL (ref 150–400)
RBC: 4.66 MIL/uL (ref 3.87–5.11)
RDW: 14.7 % (ref 11.5–15.5)
WBC: 16.7 10*3/uL — ABNORMAL HIGH (ref 4.0–10.5)

## 2016-01-02 LAB — COMPREHENSIVE METABOLIC PANEL
ALK PHOS: 56 U/L (ref 38–126)
ALK PHOS: 46 U/L (ref 38–126)
ALT: 18 U/L (ref 14–54)
ALT: 16 U/L (ref 14–54)
ANION GAP: 11 (ref 5–15)
AST: 21 U/L (ref 15–41)
AST: 26 U/L (ref 15–41)
Albumin: 2.2 g/dL — ABNORMAL LOW (ref 3.5–5.0)
Albumin: 2.4 g/dL — ABNORMAL LOW (ref 3.5–5.0)
Anion gap: 10 (ref 5–15)
BILIRUBIN TOTAL: 0.7 mg/dL (ref 0.3–1.2)
BUN: 31 mg/dL — ABNORMAL HIGH (ref 6–20)
BUN: 29 mg/dL — AB (ref 6–20)
CALCIUM: 9.4 mg/dL (ref 8.9–10.3)
CO2: 26 mmol/L (ref 22–32)
CO2: 27 mmol/L (ref 22–32)
CREATININE: 1.28 mg/dL — AB (ref 0.44–1.00)
Calcium: 9.4 mg/dL (ref 8.9–10.3)
Chloride: 98 mmol/L — ABNORMAL LOW (ref 101–111)
Chloride: 100 mmol/L — ABNORMAL LOW (ref 101–111)
Creatinine, Ser: 1.22 mg/dL — ABNORMAL HIGH (ref 0.44–1.00)
GFR calc Af Amer: 49 mL/min — ABNORMAL LOW (ref >60)
GFR calc non Af Amer: 40 mL/min — ABNORMAL LOW (ref >60)
GFR, EST AFRICAN AMERICAN: 46 mL/min — AB (ref >60)
GFR, EST NON AFRICAN AMERICAN: 42 mL/min — AB (ref >60)
GLUCOSE: 117 mg/dL — AB (ref 65–99)
Glucose, Bld: 95 mg/dL (ref 65–99)
POTASSIUM: 3.3 mmol/L — AB (ref 3.5–5.1)
Potassium: 3 mmol/L — ABNORMAL LOW (ref 3.5–5.1)
SODIUM: 135 mmol/L (ref 135–145)
Sodium: 137 mmol/L (ref 135–145)
TOTAL PROTEIN: 5.7 g/dL — AB (ref 6.5–8.1)
Total Bilirubin: 1 mg/dL (ref 0.3–1.2)
Total Protein: 5 g/dL — ABNORMAL LOW (ref 6.5–8.1)

## 2016-01-02 LAB — URINE CULTURE: CULTURE: NO GROWTH

## 2016-01-02 LAB — PROTIME-INR
INR: 1.32
PROTHROMBIN TIME: 16.5 s — AB (ref 11.4–15.2)

## 2016-01-02 LAB — LACTIC ACID, PLASMA: Lactic Acid, Venous: 1 mmol/L (ref 0.5–1.9)

## 2016-01-02 LAB — GLUCOSE, CAPILLARY
GLUCOSE-CAPILLARY: 135 mg/dL — AB (ref 65–99)
Glucose-Capillary: 109 mg/dL — ABNORMAL HIGH (ref 65–99)
Glucose-Capillary: 121 mg/dL — ABNORMAL HIGH (ref 65–99)
Glucose-Capillary: 129 mg/dL — ABNORMAL HIGH (ref 65–99)
Glucose-Capillary: 133 mg/dL — ABNORMAL HIGH (ref 65–99)
Glucose-Capillary: 148 mg/dL — ABNORMAL HIGH (ref 65–99)

## 2016-01-02 LAB — CBC
HCT: 40 % (ref 36.0–46.0)
Hemoglobin: 14 g/dL (ref 12.0–15.0)
MCH: 31 pg (ref 26.0–34.0)
MCHC: 35 g/dL (ref 30.0–36.0)
MCV: 88.5 fL (ref 78.0–100.0)
PLATELETS: 259 10*3/uL (ref 150–400)
RBC: 4.52 MIL/uL (ref 3.87–5.11)
RDW: 14.3 % (ref 11.5–15.5)
WBC: 16.1 10*3/uL — ABNORMAL HIGH (ref 4.0–10.5)

## 2016-01-02 MED ORDER — SODIUM CHLORIDE 0.9 % IV BOLUS (SEPSIS)
500.0000 mL | Freq: Once | INTRAVENOUS | Status: AC
  Administered 2016-01-02: 500 mL via INTRAVENOUS

## 2016-01-02 MED ORDER — MAGNESIUM SULFATE 2 GM/50ML IV SOLN
2.0000 g | Freq: Once | INTRAVENOUS | Status: AC
  Administered 2016-01-02: 2 g via INTRAVENOUS
  Filled 2016-01-02: qty 50

## 2016-01-02 MED ORDER — LORAZEPAM 2 MG/ML IJ SOLN
0.5000 mg | Freq: Once | INTRAMUSCULAR | Status: AC
  Administered 2016-01-02: 0.5 mg via INTRAVENOUS
  Filled 2016-01-02: qty 1

## 2016-01-02 MED ORDER — DEXAMETHASONE SODIUM PHOSPHATE 4 MG/ML IJ SOLN
4.0000 mg | Freq: Four times a day (QID) | INTRAMUSCULAR | Status: DC
  Administered 2016-01-02 – 2016-01-08 (×26): 4 mg via INTRAVENOUS
  Filled 2016-01-02 (×26): qty 1

## 2016-01-02 MED ORDER — DEXAMETHASONE SODIUM PHOSPHATE 10 MG/ML IJ SOLN
10.0000 mg | Freq: Once | INTRAMUSCULAR | Status: AC
  Administered 2016-01-02: 10 mg via INTRAVENOUS
  Filled 2016-01-02: qty 1

## 2016-01-02 NOTE — Plan of Care (Signed)
Problem: Education: Goal: Knowledge of [place] [facility] Education information/materials will improve Outcome: Progressing [place] general education hand out given   Problem: Safety: Goal: Ability to remain free from injury will improve Outcome: Progressing Educate to use call light and ask for nursing assistance to ambulate or go to [facility]

## 2016-01-02 NOTE — Progress Notes (Signed)
Inpatient Rehabilitation  PT has recommended IP Rehab.  Neurosurgical work up and treatment planning still underway.  We will follow along for timing and appropriateness of IP rehab consult.  Please call if questions.  [place] Admissions Coordinator Cell [phone] Office [phone]

## 2016-01-02 NOTE — Progress Notes (Addendum)
BP 85/56, 500 bolus Nacl given  BP after bolus 85/72 MAP 79

## 2016-01-02 NOTE — Evaluation (Signed)
Clinical/Bedside Swallow Evaluation Patient Details  Name: Alexis Buck MRN: [id number] Date of Birth: October 23, 1940  Today's Date: 01/02/2016 Time: SLP Start Time (ACUTE ONLY): 79 SLP Stop Time (ACUTE ONLY): 0940 SLP Time Calculation (min) (ACUTE ONLY): 25 min  Past Medical History:  Past Medical History:  Diagnosis Date  . Benign neoplasm of breast 2009  . Hypertension    Past Surgical History:  Past Surgical History:  Procedure Laterality Date  . BREAST BIOPSY Left 2009  . CESAREAN SECTION    . COLONOSCOPY  2006   Dr.KHAN  . POLYPECTOMY  2006   HPI:  75 y.o. female with medical history significant for tobacco abuse, hypertension, and GERD who presented for evaluation of altered mental status. Patient had reportedly been complaining of difficulty focusing and vision changes going back approximately 6 weeks. Also over the past 6 weeks, her daughter is noted episodes in which the patient appears to "stare off." Patient was found  to be sitting on the floor after an apparent fall on day of admission. She was more confused than she had been, asking nonsensical questions, and was taken to the [facility] emergency department for evaluation.  In the ED chest x-ray featured a conspicuous mass in the left lung. CT of the head and chest were obtained, revealing vasogenic edema surrounding a brain mass in the right parieto-occipital region, suspected secondary to lung primary, and with interval increase in the ventricular system suggestive of developing hydrocephalus. Neurosurgery was consulted by the ED physician and advised a medical admission to [facility].  Patient was then transferred to [facility].   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  She has difficulty manipulating/coordinating mastication and use of straw; swallow response is brisk with no overt s/s of aspiration.  Pt with cognitive deficits,  inconsistent ability to follow commands, visual hallucinations.  Recommend dysphagia 3 diet, thin liquids, meds whole with liquid; please order cognitive eval per SLP and OT eval.      Aspiration Risk  Mild aspiration risk    Diet Recommendation   dys 3, thin liquids  Medication Administration: Whole meds with liquid    Other  Recommendations Oral Care Recommendations: Oral care BID   Follow up Recommendations   (tba)    Frequency and Duration min 2x/week  2 weeks       Prognosis Prognosis for Safe Diet Advancement: Fair      Swallow Study   General Date of Onset: 01/01/16 HPI: 75 y.o. female with medical history significant for tobacco abuse, hypertension, and GERD who presented for evaluation of altered mental status. Patient had reportedly been complaining of difficulty focusing and vision changes going back approximately 6 weeks. Also over the past 6 weeks, her daughter is noted episodes in which the patient appears to "stare off." Patient was found  to be sitting on the floor after an apparent fall on day of admission. She was more confused than she had been, asking nonsensical questions, and was taken to the [facility] emergency department for evaluation.  In the ED chest x-ray featured a conspicuous mass in the left lung. CT of the head and chest were obtained, revealing vasogenic edema surrounding a brain mass in the right parieto-occipital region, suspected secondary to lung primary, and with interval increase in the ventricular system suggestive of developing hydrocephalus. Neurosurgery was consulted by the ED physician and advised a medical admission to [facility].  Patient was then transferred to [facility]. Type of Study: Bedside Swallow Evaluation Diet Prior to this Study: Regular;Thin liquids Temperature Spikes Noted: No Respiratory Status: Room air History of Recent Intubation: No Behavior/Cognition: Alert;Confused Oral Cavity Assessment: Within  Functional Limits Oral Care Completed by SLP: No Oral Cavity - Dentition: Edentulous Vision: Impaired for self-feeding Self-Feeding Abilities: Needs assist Patient Positioning: Upright in bed Baseline Vocal Quality: Normal Volitional Cough: Strong Volitional Swallow: Able to elicit    Oral/Motor/Sensory Function Overall Oral Motor/Sensory Function:  (apraxia)   Ice Chips Ice chips: Not tested   Thin Liquid Thin Liquid: Within functional limits Presentation: Straw;Cup    Nectar Thick Nectar Thick Liquid: Not tested   Honey Thick Honey Thick Liquid: Not tested   Puree Puree: Impaired Oral Phase Impairments: Reduced lingual movement/coordination   Solid   GO   Solid: Impaired Oral Phase Impairments: Reduced lingual movement/coordination        Juan Quam Laurice 01/02/2016,10:03 AM

## 2016-01-02 NOTE — Progress Notes (Signed)
PROGRESS NOTE    Alexis Buck  DUK:[id number] DOB: 02-Apr-1941 DOA: 01/01/2016 PCP: Lavera Guise, MD    Brief Narrative:  Alexis Buck is a 75 y.o. female with medical history significant for tobacco abuse, hypertension, and GERD who presented for evaluation of altered mental status. Patient had reportedly been complaining of difficulty focusing and vision changes going back approximately 6 weeks. Also over the past 6 weeks, her daughter is noted episodes in which the patient appears to "stare off." Patient was found  to be sitting on the floor after an apparent fall on day of admission. She was more confused than she had been, asking nonsensical questions, and was taken to the [facility] emergency department for evaluation.  In the ED chest x-ray featured a conspicuous mass in the left lung. CT of the head and chest were obtained, revealing vasogenic edema surrounding a brain mass in the right parieto-occipital region, suspected secondary to lung primary, and with interval increase in the ventricular system suggestive of developing hydrocephalus. Neurosurgery was consulted by the ED physician and advised a medical admission to [facility].  Patient was then transferred to [place]:   Principal Problem:   Dehydration Active Problems:   Essential hypertension   Lung mass   Brain metastasis (HCC)   Altered mental status   Kidney disease   Hypokalemia   Leukocytosis   Hypercalcemia  Brain and lung masses  - Per radiology, large left lung mass appears to be primary, and brain mass a met - Neurosurgery is consulting and much appreciated, will await recs  - Decadron 10 mg IV given on admission for vasogenic brain edema; plan to follow with 4 mg q6h  - MRI advised by radiology with contrast, but postponed d/t low GFR - repeat BMP this morning- pending results  Altered mental status  - Secondary to brain mass, suspected to represent a metastasis  from lung  - Awaiting consultant recommendations   - per family patient mental status today is similar to what has been for past 6 weeks - Alert and oriented to person, place and time (questionably oriented to situation as patient can tell you she was getting weaker and more unsteady at home but could not recall that she was told there was a mass in her chest or her head)  Kidney disease - SCr 1.83 on admission with no prior available for comparison  - Suspect there is an acute component given recent poor oral intake and s/s of dehydration  - Fluid-resuscitating while avoiding nephrotoxins  - Repeat chem panel pending - will watch UOP   Hypokalemia and Hypomagnesemia  - Serum potassium 2.7 on admission  - Likely secondary to HCTZ, will hold  - Supplemental potassium added to IVF  - Magnesium level low; 2g IV given  - Monitor on telemetry  - Repeat chem panel pending   Hypercalcemia  - Serum calcium 11.7 on admission - Suspected secondary to malignant process  - Continue rehydration with IVF  - Repeat pending  Leukocytosis  - WBC 24,500 on admission with neutrophilic predominance  - Suspect this is secondary to the malignant process rather than infection  - Blood and urine cultures are incubating  - Watching off of abx for now  - patient afebrile, not consistently tachycardic (HR during exam was 60s-90s), not hypotensive - repeat CBCD pending  Hypertension - At goal currently - Managed with Coreg and HCTZ at home  - Hold  HCTZ as above while replacing potassium; continue Coreg with monitoring  - blood pressures well controlled    DVT prophylaxis: heparin  Code Status: full Family Communication: Spoke with patient's daughter, son in Sports coach, granddaughter and grandson all of whom were bedside  Disposition Plan: back to independent living in senior community   Consultants:   Neurosurgery  Procedures:   none  Antimicrobials:   none    Subjective: Patient  is awake and alert this morning.  She did cry during questioning stating repeatedly she did not want to be alone and she is lucky because her daughter told her she would not be alone.  She does voices that she is fearful because yesterday she heard someone use the words "chemotherapy" and "radiation".  Patient's family is anxious to know of treatment plan.  Patient's daughter mentions that previously the patient has said she does not want "to be cut"/ have surgery.  However, yesterday, after learning of diagnosis, patient seemed more open to possible surgery.  Family as well as patient would like to know if surgery is to happen.  No change in mental status/ cognition from admission to currently per family.  Objective: Vitals:   01/02/16 0400 01/02/16 0429 01/02/16 0520 01/02/16 0635  BP: 106/68  108/84 128/68  Pulse: 80  75 79  Resp: 19  15 (!) 25  Temp:      TempSrc:      SpO2: 97%  97% 100%  Weight:  73.2 kg (161 lb 6 oz)    Height:        Intake/Output Summary (Last 24 hours) at 01/02/16 0846 Last data filed at 01/02/16 0636  Gross per 24 hour  Intake           826.67 ml  Output              375 ml  Net           451.67 ml   Filed Weights   01/01/16 2010 01/01/16 2359 01/02/16 0429  Weight: 72.3 kg (159 lb 6.3 oz) 73.2 kg (161 lb 6 oz) 73.2 kg (161 lb 6 oz)    Examination:  General exam: Appears calm and comfortable  HEENT: head is normocephalic, EOMI, pupils are reactive to light and accomdation, no nystagmus, mild catarcts noted, arcus senelis noted, no icterus, mucuos membranes are dry, no bruits or JVD noted Respiratory system: Clear to auscultation. Respiratory effort normal. Cardiovascular system: S1 & S2 heard, RRR. No JVD, murmurs, rubs, gallops or clicks. No pedal edema. Gastrointestinal system: Abdomen is nondistended, soft and nontender. No organomegaly or masses felt. Normal bowel sounds heard. Central nervous system: Alert and oriented to person, time and place  (questionably oriented to situation), CN II- XII grossly intact, patient is able to follow commands, DTRS normal (biceps, patellar and Achilles). No focal neurological deficits. Extremities: Symmetric 5 x 5 power in upper and lower extremities bilaterally Skin: No rashes, lesions or ulcers Psychiatry: Judgement appears normal; insight questionable as patient could not recall what she was told about diagnosis since admission. Mood & affect appropriate.     Data Reviewed: I have personally reviewed following labs and imaging studies  CBC:  Recent Labs Lab 01/01/16 1047 01/02/16 0025  WBC 24.5* 16.1*  NEUTROABS 22.3*  --   HGB 18.6* 14.0  HCT 55.2* 40.0  MCV 90.9 88.5  PLT 368 902   Basic Metabolic Panel:  Recent Labs Lab 01/01/16 1047 01/01/16 2206 01/02/16 0025  NA 140  --  135  K 2.7*  --  3.0*  CL 93*  --  98*  CO2 31  --  27  GLUCOSE 181*  --  117*  BUN 37*  --  31*  CREATININE 1.83*  --  1.28*  CALCIUM 11.7* 10.1 9.4  MG  --  1.6*  --    GFR: Estimated Creatinine Clearance: 32.6 mL/min (by C-G formula based on SCr of 1.28 mg/dL). Liver Function Tests:  Recent Labs Lab 01/01/16 1047 01/02/16 0025  AST 34 21  ALT 22 16  ALKPHOS 69 46  BILITOT 1.2 1.0  PROT 7.0 5.0*  ALBUMIN 3.2* 2.2*   No results for input(s): LIPASE, AMYLASE in the last 168 hours. No results for input(s): AMMONIA in the last 168 hours. Coagulation Profile:  Recent Labs Lab 01/02/16 0025  INR 1.32   Cardiac Enzymes: No results for input(s): CKTOTAL, CKMB, CKMBINDEX, TROPONINI in the last 168 hours. BNP (last 3 results) No results for input(s): PROBNP in the last 8760 hours. HbA1C: No results for input(s): HGBA1C in the last 72 hours. CBG:  Recent Labs Lab 01/01/16 2153 01/02/16 0002 01/02/16 0348 01/02/16 0749  GLUCAP 118* 133* 121* 109*   Lipid Profile: No results for input(s): CHOL, HDL, LDLCALC, TRIG, CHOLHDL, LDLDIRECT in the last 72 hours. Thyroid Function  Tests:  Recent Labs  01/01/16 2215  TSH 0.392   Anemia Panel: No results for input(s): VITAMINB12, FOLATE, FERRITIN, TIBC, IRON, RETICCTPCT in the last 72 hours. Sepsis Labs:  Recent Labs Lab 01/01/16 1047 01/01/16 1718 01/01/16 2210 01/02/16 0025  LATICACIDVEN 3.3* 2.1* 1.5 1.0    Recent Results (from the past 240 hour(s))  Blood Culture (routine x 2)     Status: None (Preliminary result)   Collection Time: 01/01/16 10:47 AM  Result Value Ref Range Status   Specimen Description BLOOD RIGHT ANTECUBITAL  Final   Special Requests BOTTLES DRAWN AEROBIC AND ANAEROBIC  [place]  Final   Culture NO GROWTH < 24 HOURS  Final   Report Status PENDING  Incomplete  Blood Culture (routine x 2)     Status: None (Preliminary result)   Collection Time: 01/01/16 10:47 AM  Result Value Ref Range Status   Specimen Description BLOOD LEFT HAND  Final   Special Requests BOTTLES DRAWN AEROBIC AND ANAEROBIC  3CC  Final   Culture NO GROWTH < 24 HOURS  Final   Report Status PENDING  Incomplete  MRSA PCR Screening     Status: None   Collection Time: 01/01/16  8:14 PM  Result Value Ref Range Status   MRSA by PCR NEGATIVE NEGATIVE Final    Comment:        The GeneXpert MRSA Assay (FDA approved for NASAL specimens only), is one component of a comprehensive MRSA colonization surveillance program. It is not intended to diagnose MRSA infection nor to guide or monitor treatment for MRSA infections.          Radiology Studies: Ct Head Wo Contrast  Result Date: 01/01/2016 CLINICAL DATA:  75 year old female with a history of altered mental status EXAM: CT HEAD WITHOUT CONTRAST TECHNIQUE: Contiguous axial images were obtained from the base of the skull through the vertex without intravenous contrast. COMPARISON:  08/17/2012, CT of the same date FINDINGS: Extensive confluent hypodensity in the right subcortical white matter extending to the ventricular margin. Gray-white differentiation maintained.  There is a heterogeneously dense mass in the right parieto-occipital region, extending to the inner table of the calvarium, measuring 3.4 cm  by 2.9 cm. Local mass effect, with distortion of the posterior horn of the right lateral ventricle. The ventricles are enlarged compared to the prior CT. No intracranial hemorrhage. Expanded appearance of the posterior right corpus callosum, which is hypodense. Unremarkable appearance of the calvarium without acute fracture or aggressive lesion. Unremarkable appearance of the scalp soft tissues. Unremarkable appearance of the bilateral orbits. Mastoid air cells are clear. No significant paranasal sinus disease. IMPRESSION: Evidence of vasogenic edema of the right cerebral white matter secondary to mass (3.5 cm x 2.9 cm) in the right parieto-occipital region. Given the appearance of the chest CT performed on today's date, this most likely represents metastatic disease. Further evaluation with contrast-enhanced MR is indicated. Expanded appearance of the posterior right corpus callosum, potentially edema and/or tumor. Increasing size of the ventricular system compare to the prior CT, potentially representing developing hydrocephalus. These results were called by telephone at the time of interpretation on 01/01/2016 at 12:39 pm to Dr. Lavonia Drafts , who verbally acknowledged these results. Signed, Dulcy Fanny. Earleen Newport, DO [facility] Radiology Electronically Signed   By: Corrie Mckusick D.O.   On: 01/01/2016 12:40   Ct Chest W Contrast  Result Date: 01/01/2016 CLINICAL DATA:  Recent altered mental status with mass on recent chest x-ray EXAM: CT CHEST WITH CONTRAST TECHNIQUE: Multidetector CT imaging of the chest was performed during intravenous contrast administration. CONTRAST:  51m ISOVUE-300 IOPAMIDOL (ISOVUE-300) INJECTION 61% COMPARISON:  Plain film from earlier in the same day FINDINGS: Cardiovascular: Thoracic aorta  demonstrates some calcific changes without evidence of aneurysm or dissection. The pulmonary artery as visualized is within normal limits. Coronary calcifications are noted. The cardiac structures are otherwise within normal limits. Mediastinum/Nodes: The thoracic inlet demonstrates bilateral calcified thyroid nodules. The largest of these lies on the right measuring approximately 2.5 cm in greatest dimension. These are stable from a prior exam from 2014. Fluid level is noted within the esophagus which may be related to some distal narrowing. No significant hilar or mediastinal adenopathy is noted. Lungs/Pleura: Mild dependent atelectasis is noted in the lower lobes. Small bilateral pleural effusions are seen. In the left lower lobe abutting the major fissure, there is a 6.0 by 3.7 by 5.5 cm soft tissue mass lesion. On the sagittal reconstructions, the lesion appears to extend across the minor fissure into the upper lobe. It envelops branches of the left lower lobe pulmonary artery. There is also impingement upon the lower lobe bronchial tree. These changes are consistent with primary pulmonary neoplasm. Upper Abdomen: Within normal limits. Musculoskeletal: Degenerative changes of the thoracic spine are noted. 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 Electronically Signed   By: MInez CatalinaM.D.   On: 01/01/2016 12:39   Dg Chest Port 1 View  Result Date: 01/01/2016 CLINICAL DATA:  Altered mental status, recent altered mental status EXAM: PORTABLE CHEST 1 VIEW COMPARISON:  02/10/2005 FINDINGS: Cardiac shadow is mildly enlarged. There is 7 cm mass lesions superimposed over the left hilar region new from the prior exam. No bony abnormality is seen. IMPRESSION: Changes consistent with a large left lung mass. CT of the chest is recommended for further evaluation. Electronically Signed   By: MInez CatalinaM.D.   On: 01/01/2016 11:08        Scheduled Meds: .  busPIRone  7.5 mg Oral BID  . carvedilol  12.5 mg Oral BID  . citalopram  40 mg Oral Daily  . dexamethasone  4 mg Intravenous Q6H  . heparin  5,000 Units Subcutaneous Q8H  . insulin aspart  0-9 Units Subcutaneous Q4H  . pantoprazole  40 mg Oral Daily  . simvastatin  20 mg Oral QPM  . sodium chloride flush  3 mL Intravenous Q12H   Continuous Infusions: . 0.9 % NaCl with KCl 40 mEq / L 100 mL/hr (01/01/16 2214)     LOS: 1 day    Time spent: 40 minutes    Newman Pies, MD [facility] Pager [phone]  If 7PM-7AM, please contact night-coverage [email or web address] Password [facility] 01/02/2016, 8:46 AM

## 2016-01-02 NOTE — Consult Note (Signed)
CC:  Mental status change  HPI: Alexis Buck is a 75 y.o. female transferred to [facility] from [facility] for new diagnosis brain mass. History is provided primarily by family who state she was in her usual state until yesterday when they noted she was speaking incomprehensibly. She was taken to the ED where CT discovered lung and brain masses. She does have a history of tobacco smoking and HTN, but otherwise no significant medical problems. Upon questioning today, she denies any real HA, no visual changes, and no N/T/W of the arms or legs.  PMH: Past Medical History:  Diagnosis Date  . Benign neoplasm of breast 2009  . Hypertension     PSH: Past Surgical History:  Procedure Laterality Date  . BREAST BIOPSY Left 2009  . CESAREAN SECTION    . COLONOSCOPY  2006   Dr.KHAN  . POLYPECTOMY  2006    SH: Social History  Substance Use Topics  . Smoking status: Current Every Day Smoker    Packs/day: 1.00    Years: 30.00  . Smokeless tobacco: Not on file  . Alcohol use Yes    MEDS: Prior to Admission medications   Medication Sig Start Date End Date Taking? Authorizing Provider  aspirin EC 81 MG tablet Take 81 mg by mouth daily.    Historical Provider, MD  busPIRone (BUSPAR) 7.5 MG tablet Take 7.5 mg by mouth 2 (two) times daily.    Historical Provider, MD  carvedilol (COREG) 12.5 MG tablet Take 12.5 mg by mouth 2 (two) times daily.    Historical Provider, MD  citalopram (CELEXA) 40 MG tablet Take 40 mg by mouth daily.    Historical Provider, MD  hydrochlorothiazide (HYDRODIURIL) 12.5 MG tablet Take 12.5 mg by mouth every morning.    Historical Provider, MD  Multiple Vitamins-Minerals (CENTRUM SILVER 50+WOMEN) TABS Take by mouth.    Historical Provider, MD  omeprazole (PRILOSEC) 20 MG capsule Take 20 mg by mouth daily.    Historical Provider, MD  simvastatin (ZOCOR) 20 MG tablet Take 20 mg by mouth every evening.    Historical Provider, MD    ALLERGY: No Known  Allergies  ROS: ROS  NEUROLOGIC EXAM: Awake, alert Speech fluent, appropriate CN grossly intact Motor exam: Upper Extremities Deltoid Bicep Tricep Grip  Right 5/5 5/5 5/5 5/5  Left 5/5 5/5 5/5 5/5   Lower Extremity IP Quad PF DF EHL  Right 5/5 5/5 5/5 5/5 5/5  Left 5/5 5/5 5/5 5/5 5/5   Sensation grossly intact to LT  [facility]: CT chest demonstrates a left lung mass c/w primary lung neoplasm.  Ewing reviewed which demonstrates a 2.9 x 3.5cm right parasagittal parietal mass with significant surrounding edema.  There is mass effect upon the right trigone and lateral ventricle without ventriculomegaly.  IMPRESSION: - 75 y.o. 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  PLAN: - Will need MRI brain w/w/o Gad with STEREOTACTIC PROTOCOL  I did review the CT findings with the patient and family. We briefly discussed possible treatment options for the brain disease including radiation alone v surgery and adjuvant radiation. At this point we need MRI to better delineate the brain disease. All questions were answered.

## 2016-01-02 NOTE — Evaluation (Signed)
Physical Therapy Evaluation Patient Details Name: Alexis Buck MRN: [id number] DOB: 1940/12/24 Today's Date: 01/02/2016   History of Present Illness  75 y.o. female with medical history significant for tobacco abuse, hypertension, and GERD who presented for evaluation of altered mental status. Patient had reportedly been complaining of difficulty focusing and vision changes going back approximately 6 weeks. Also over the past 6 weeks, her daughter is noted episodes in which the patient appears to "stare off." Patient was found to be sitting on the floor after an apparent fall on day of admission. She was more confused than she had been, asking nonsensical questions, and was taken to the [facility] emergency department for evaluation. In the ED chest x-ray featured a conspicuous mass in the left lung. CT of the head and chest were obtained, revealing vasogenic edema surrounding a brain mass in the right parieto-occipital region, suspected secondary to lung primary, and with interval increase in the ventricular system suggestive of developing hydrocephalus. Neurosurgery was consulted by the ED physician and advised a medical admission to [facility]. Patient was then transferred to [facility].    Clinical Impression  Pt admitted with above diagnosis. Pt currently with functional limitations due to the deficits listed below (see PT Problem List). PTA Alexis Buck was Ind but with intermittent confusion over the past 6 weeks.  She currently requires up to mod assist to ambulate as pt experiencing visual hallucinations and requires HHA to direct.  Recommending CIR to achieve highest level of independence and to decrease burden of care.  Family very supportive. Pt will benefit from skilled PT to increase their independence and safety with mobility to allow discharge to the venue listed below.      Follow Up Recommendations CIR    Equipment Recommendations  Rolling walker with 5" wheels     Recommendations for Other Services Rehab consult;OT consult     Precautions / Restrictions Precautions Precautions: Fall Restrictions Weight Bearing Restrictions: No      Mobility  Bed Mobility Overal bed mobility: Needs Assistance Bed Mobility: Supine to Sit     Supine to sit: Modified independent (Device/Increase time)     General bed mobility comments: Increased time  Transfers Overall transfer level: Needs assistance Equipment used: None Transfers: Sit to/from Stand Sit to Stand: Min guard         General transfer comment: Close min guard as pt appears unsteady upon standing and reports feeling dizzy which clears quickly.    Ambulation/Gait Ambulation/Gait assistance: Mod assist Ambulation Distance (Feet): 100 Feet Assistive device: 1 person hand held assist;None Gait Pattern/deviations: Step-through pattern;Decreased stride length Gait velocity: decreased   General Gait Details: Pt unable to follow verbal directional cues and requires 1 person HHA to direct pt to door and hallway.  Pt experiencing visual hallucinations and seeing objects (door, lamp, etc.) that are not present.  Pt requires up to mod assist with HHA toward end of ambulation due to fatigue.  Stairs            Wheelchair Mobility    Modified Rankin (Stroke Patients Only) Modified Rankin (Stroke Patients Only) Pre-Morbid Rankin Score: No significant disability Modified Rankin: Moderately severe disability     Balance Overall balance assessment: Needs assistance Sitting-balance support: No upper extremity supported;Feet supported Sitting balance-Leahy Scale: Good     Standing balance support: No upper extremity supported;During functional activity Standing balance-Leahy Scale: Fair Standing balance comment: Able to stand at bedside without UE support  Pertinent Vitals/Pain Pain Assessment: No/denies pain    Home Living Family/patient  expects to be discharged to:: Private residence Living Arrangements: Alone Available Help at Discharge: Family;Available 24 hours/day Type of Home: Apartment Home Access: Elevator     Home Layout: One level Home Equipment: Cane - single point;Shower seat;Hand held shower head      Prior Function Level of Independence: Independent               Hand Dominance   Dominant Hand: Right    Extremity/Trunk Assessment   Upper Extremity Assessment: Defer to OT evaluation           Lower Extremity Assessment: Overall WFL for tasks assessed      Cervical / Trunk Assessment: Normal  Communication   Communication: HOH  Cognition Arousal/Alertness: Awake/alert Behavior During Therapy: WFL for tasks assessed/performed Overall Cognitive Status: Impaired/Different from baseline Area of Impairment: Orientation;Attention;Memory;Safety/judgement;Following commands;Awareness;Problem solving Orientation Level: Disoriented to;Place;Time;Situation Current Attention Level: Sustained Memory: Decreased short-term memory Following Commands: Follows one step commands inconsistently Safety/Judgement: Decreased awareness of safety;Decreased awareness of deficits Awareness: Intellectual Problem Solving: Slow processing;Decreased initiation;Difficulty sequencing;Requires verbal cues;Requires tactile cues General Comments: 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  Pt expereriencing visual hallucinations seeing a door on the wall.  Requires HHA to direct pt while ambulating.    General Comments General comments (skin integrity, edema, etc.): Son tearful during session saying, "this is not who she was 2 days ago".  Spoke with son in hallway after session complete to provide education on apraxia and visual hallucinations.  Son Patent attorney.    Exercises        Assessment/Plan    PT Assessment Patient needs  continued PT services  PT Diagnosis Difficulty walking;Altered mental status   PT Problem List Decreased activity tolerance;Decreased balance;Decreased knowledge of use of DME;Decreased safety awareness;Decreased cognition  PT Treatment Interventions DME instruction;Gait training;Stair training;Functional mobility training;Therapeutic activities;Therapeutic exercise;Balance training;Neuromuscular re-education;Cognitive remediation;Patient/family education   PT Goals (Current goals can be found in the Care Plan section) Acute Rehab PT Goals Patient Stated Goal: pt unable, son says "we will provide her with any help she needs" PT Goal Formulation: With patient/family Time For Goal Achievement: 01/16/16 Potential to Achieve Goals: Good    Frequency Min 3X/week   Barriers to discharge Decreased caregiver support Lives alone    Co-evaluation               End of Session Equipment Utilized During Treatment: Gait belt Activity Tolerance: Patient tolerated treatment well;Patient limited by fatigue Patient left: in chair;with call bell/phone within reach;with chair alarm set;with family/visitor present Nurse Communication: Mobility status         Time: 3419-3790 PT Time Calculation (min) (ACUTE ONLY): 31 min   Charges:   PT Evaluation $PT Eval High Complexity: 1 Procedure PT Treatments $Gait Training: 8-22 mins   PT G Codes:       Collie Siad PT, DPT  Pager: [phone] Phone: [phone] 01/02/2016, 11:10 AM

## 2016-01-03 ENCOUNTER — Inpatient Hospital Stay (HOSPITAL_COMMUNITY): Payer: Medicare Other

## 2016-01-03 LAB — BASIC METABOLIC PANEL
Anion gap: 9 (ref 5–15)
BUN: 29 mg/dL — AB (ref 6–20)
CHLORIDE: 97 mmol/L — AB (ref 101–111)
CO2: 27 mmol/L (ref 22–32)
Calcium: 9.6 mg/dL (ref 8.9–10.3)
Creatinine, Ser: 1.04 mg/dL — ABNORMAL HIGH (ref 0.44–1.00)
GFR calc Af Amer: 59 mL/min — ABNORMAL LOW (ref >60)
GFR calc non Af Amer: 51 mL/min — ABNORMAL LOW (ref >60)
GLUCOSE: 103 mg/dL — AB (ref 65–99)
POTASSIUM: 3.3 mmol/L — AB (ref 3.5–5.1)
Sodium: 133 mmol/L — ABNORMAL LOW (ref 135–145)

## 2016-01-03 LAB — GLUCOSE, CAPILLARY
GLUCOSE-CAPILLARY: 140 mg/dL — AB (ref 65–99)
Glucose-Capillary: 124 mg/dL — ABNORMAL HIGH (ref 65–99)
Glucose-Capillary: 112 mg/dL — ABNORMAL HIGH (ref 65–99)
Glucose-Capillary: 152 mg/dL — ABNORMAL HIGH (ref 65–99)
Glucose-Capillary: 113 mg/dL — ABNORMAL HIGH (ref 65–99)
Glucose-Capillary: 157 mg/dL — ABNORMAL HIGH (ref 65–99)

## 2016-01-03 NOTE — Progress Notes (Signed)
PROGRESS NOTE    Alexis Buck  OZR:[id number] DOB: 1941-02-10 DOA: 01/01/2016 PCP: Lyndon Code, MD   Brief Narrative:  Alexis Buck a 75 y.o.femalewith medical history significant for tobacco abuse, hypertension, and GERD who presented for evaluation of altered mental status. Patient had reportedly been complaining of difficulty focusing and vision changes going back approximately 6 weeks. Also over the past 6 weeks, her daughter is noted episodes in which the patient appears to "stare off." Patient was found  to be sitting on the floor after an apparent fall on day of admission. She was more confused than she had been, asking nonsensical questions, and was taken to the [facility] emergency department for evaluation.  In the ED chest x-ray featured a conspicuous mass in the left lung. CT of the head and chest were obtained, revealing vasogenic edema surrounding a brain mass in the right parieto-occipital region, suspected secondary to lung primary, and with interval increase in the ventricular system suggestive of developing hydrocephalus. Neurosurgery was consulted by the ED physician and advised a medical admission to [facility].  Patient was then transferred to [facility] for evaluation by neurosurgery.   Assessment & Plan:   Principal Problem:   Dehydration Active Problems:   Essential hypertension   Lung mass   Brain metastasis (HCC)   Altered mental status   Kidney disease   Hypokalemia   Leukocytosis   Hypercalcemia   Brain tumor (HCC)   Brain and lung masses  - Per radiology, large left lung mass appears to be primary, and brain mass a met - Neurosurgery is consulting and much appreciated, will await recs  - Decadron 10 mg IV given on admission for vasogenic brain edema; plan to follow with 4 mg q6h  - MRI attempted twice but patient unable to stay still for an adequate study  Altered mental status  - Secondary to brain mass, suspected to represent  a metastasis from lung  - Awaiting consultant recommendations  - per family patient mental status today is slightly more confused today - Alert and oriented to person, place  Kidney disease - SCr 1.83 on admission with no prior available for comparison  - Suspect there is an acute component given recent poor oral intake and s/s of dehydration  - Fluid-resuscitating while avoiding nephrotoxins  - will watch UOP - repeat BMP today  Hypokalemia and Hypomagnesemia  - Serum potassium 2.7 on admission  - Likely secondary to HCTZ, will hold   - Magnesium level low; 2g IV given at start of admission  - Monitor on telemetry  - Repeat chem panel pending   Hypercalcemia  - Serum calcium 11.7 on admission - Suspected secondary to malignant process  - Continue rehydration with IVF  - Repeat pending  Leukocytosis  - WBC 24,500 on admission with neutrophilic predominance  - Suspect this is secondary to the malignant process rather than infection  - Blood and urine cultures are incubating  - Watching off of abx for now  - patient afebrile, not consistently tachycardic (HR during exam was 60s-90s), not hypotensive  Hypertension - At goal currently - Managed with Coreg and HCTZ at home  - Hold HCTZ as above while replacing potassium; continue Coreg with monitoring  - blood pressures well controlled   DVT prophylaxis: heparin  Code Status: full Family Communication: son is bedside, discussed plan with him  Disposition Plan: unclear at this time   Consultants:   Neurosurgery  Procedures:   none  Antimicrobials:   none    Subjective: Alexis Buck appears awake and alert this morning.  She is happy and smiling and said she is having a good day.  She does become confused during questioning.  She mentions she had a MRI last night on questioning.  She says she slept well.  Per notes appears as though MRI scans were limited due to patient's agitation.  Patient denies pain, denies  headache, denies change in vision, denies chest pain, shortness of breath, abdominal pain.  As I previously mentioned she does seem confused at times.  Objective: Vitals:   01/03/16 0043 01/03/16 0432 01/03/16 0448 01/03/16 0449  BP: 103/60 (!) 125/109 132/67   Pulse: 68 (!) 56 65   Resp: 19 16 (!) 23   Temp: 97.4 F (36.3 C) 98.1 F (36.7 C)    TempSrc: Axillary Oral    SpO2: 97% 98% 97%   Weight:    73.9 kg (162 lb 14.4 oz)  Height:        Intake/Output Summary (Last 24 hours) at 01/03/16 0745 Last data filed at 01/02/16 2100  Gross per 24 hour  Intake              120 ml  Output              350 ml  Net             -230 ml   Filed Weights   01/01/16 2359 01/02/16 0429 01/03/16 0449  Weight: 73.2 kg (161 lb 6 oz) 73.2 kg (161 lb 6 oz) 73.9 kg (162 lb 14.4 oz)    Examination:  General exam: Appears calm and comfortable  Respiratory system: Clear to auscultation. Respiratory effort normal. Cardiovascular system: S1 & S2 heard, RRR. No JVD, murmurs, rubs, gallops or clicks. No pedal edema. Gastrointestinal system: Abdomen is nondistended, soft and nontender. No organomegaly or masses felt. Normal bowel sounds heard. Central nervous system: Alert and oriented to person and place.  Patient could not tell time or situation unless prompted. CN II-XII were grossly intact.  No focal deficits noted Extremities: Symmetric 5 x 5 power in upper and lower extremities bilaterally Skin: No rashes, lesions or ulcers Psychiatry: Judgement and insight appear normal. Mood & affect appropriate.     Data Reviewed: I have personally reviewed following labs and imaging studies  CBC:  Recent Labs Lab 01/01/16 1047 01/02/16 0025 01/02/16 0805  WBC 24.5* 16.1* 16.7*  NEUTROABS 22.3*  --  15.8*  HGB 18.6* 14.0 14.3  HCT 55.2* 40.0 41.6  MCV 90.9 88.5 89.3  PLT 368 259 007   Basic Metabolic Panel:  Recent Labs Lab 01/01/16 1047 01/01/16 2206 01/02/16 0025 01/02/16 0805  NA 140   --  135 137  K 2.7*  --  3.0* 3.3*  CL 93*  --  98* 100*  CO2 31  --  27 26  GLUCOSE 181*  --  117* 95  BUN 37*  --  31* 29*  CREATININE 1.83*  --  1.28* 1.22*  CALCIUM 11.7* 10.1 9.4 9.4  MG  --  1.6*  --   --    GFR: Estimated Creatinine Clearance: 34.4 mL/min (by C-G formula based on SCr of 1.22 mg/dL). Liver Function Tests:  Recent Labs Lab 01/01/16 1047 01/02/16 0025 01/02/16 0805  AST 34 21 26  ALT _0 ALKPHOS 69 46 56  BILITOT 1.2 1.0 0.7  PROT 7.0 5.0* 5.7*  ALBUMIN 3.2* 2.2* 2.4*  No results for input(s): LIPASE, AMYLASE in the last 168 hours. No results for input(s): AMMONIA in the last 168 hours. Coagulation Profile:  Recent Labs Lab 01/02/16 0025  INR 1.32   Cardiac Enzymes: No results for input(s): CKTOTAL, CKMB, CKMBINDEX, TROPONINI in the last 168 hours. BNP (last 3 results) No results for input(s): PROBNP in the last 8760 hours. HbA1C: No results for input(s): HGBA1C in the last 72 hours. CBG:  Recent Labs Lab 01/02/16 1658 01/02/16 2021 01/03/16 0047 01/03/16 0430 01/03/16 0742  GLUCAP 135* 129* 140* 124* 112*   Lipid Profile: No results for input(s): CHOL, HDL, LDLCALC, TRIG, CHOLHDL, LDLDIRECT in the last 72 hours. Thyroid Function Tests:  Recent Labs  01/01/16 2215  TSH 0.392   Anemia Panel: No results for input(s): VITAMINB12, FOLATE, FERRITIN, TIBC, IRON, RETICCTPCT in the last 72 hours. Sepsis Labs:  Recent Labs Lab 01/01/16 1047 01/01/16 1718 01/01/16 2210 01/02/16 0025  LATICACIDVEN 3.3* 2.1* 1.5 1.0    Recent Results (from the past 240 hour(s))  Blood Culture (routine x 2)     Status: None (Preliminary result)   Collection Time: 01/01/16 10:47 AM  Result Value Ref Range Status   Specimen Description BLOOD RIGHT ANTECUBITAL  Final   Special Requests BOTTLES DRAWN AEROBIC AND ANAEROBIC  [place]  Final   Culture NO GROWTH < 24 HOURS  Final   Report Status PENDING  Incomplete  Blood Culture (routine x 2)      Status: None (Preliminary result)   Collection Time: 01/01/16 10:47 AM  Result Value Ref Range Status   Specimen Description BLOOD LEFT HAND  Final   Special Requests BOTTLES DRAWN AEROBIC AND ANAEROBIC  3CC  Final   Culture NO GROWTH < 24 HOURS  Final   Report Status PENDING  Incomplete  Urine culture     Status: None   Collection Time: 01/01/16 10:47 AM  Result Value Ref Range Status   Specimen Description URINE, RANDOM  Final   Special Requests NONE  Final   Culture NO GROWTH Performed at [facility]   Final   Report Status 01/02/2016 FINAL  Final  MRSA PCR Screening     Status: None   Collection Time: 01/01/16  8:14 PM  Result Value Ref Range Status   MRSA by PCR NEGATIVE NEGATIVE Final    Comment:        The GeneXpert MRSA Assay (FDA approved for NASAL specimens only), is one component of a comprehensive MRSA colonization surveillance program. It is not intended to diagnose MRSA infection nor to guide or monitor treatment for MRSA infections.          Radiology Studies: Ct Head Wo Contrast  Result Date: 01/01/2016 CLINICAL DATA:  75 year old female with a history of altered mental status EXAM: CT HEAD WITHOUT CONTRAST TECHNIQUE: Contiguous axial images were obtained from the base of the skull through the vertex without intravenous contrast. COMPARISON:  08/17/2012, CT of the same date FINDINGS: Extensive confluent hypodensity in the right subcortical white matter extending to the ventricular margin. Gray-white differentiation maintained. There is a heterogeneously dense mass in the right parieto-occipital region, extending to the inner table of the calvarium, measuring 3.4 cm by 2.9 cm. Local mass effect, with distortion of the posterior horn of the right lateral ventricle. The ventricles are enlarged compared to the prior CT. No intracranial hemorrhage. Expanded appearance of the posterior right corpus callosum, which is hypodense. Unremarkable appearance of the  calvarium without acute fracture or aggressive  lesion. Unremarkable appearance of the scalp soft tissues. Unremarkable appearance of the bilateral orbits. Mastoid air cells are clear. No significant paranasal sinus disease. IMPRESSION: Evidence of vasogenic edema of the right cerebral white matter secondary to mass (3.5 cm x 2.9 cm) in the right parieto-occipital region. Given the appearance of the chest CT performed on today's date, this most likely represents metastatic disease. Further evaluation with contrast-enhanced MR is indicated. Expanded appearance of the posterior right corpus callosum, potentially edema and/or tumor. Increasing size of the ventricular system compare to the prior CT, potentially representing developing hydrocephalus. These results were called by telephone at the time of interpretation on 01/01/2016 at 12:39 pm to Dr. Lavonia Drafts , who verbally acknowledged these results. Signed, Dulcy Fanny. Earleen Newport, DO [facility] Radiology Electronically Signed   By: Corrie Mckusick D.O.   On: 01/01/2016 12:40   Ct Chest W Contrast  Result Date: 01/01/2016 CLINICAL DATA:  Recent altered mental status with mass on recent chest x-ray EXAM: CT CHEST WITH CONTRAST TECHNIQUE: Multidetector CT imaging of the chest was performed during intravenous contrast administration. CONTRAST:  30m ISOVUE-300 IOPAMIDOL (ISOVUE-300) INJECTION 61% COMPARISON:  Plain film from earlier in the same day FINDINGS: Cardiovascular: Thoracic aorta demonstrates some calcific changes without evidence of aneurysm or dissection. The pulmonary artery as visualized is within normal limits. Coronary calcifications are noted. The cardiac structures are otherwise within normal limits. Mediastinum/Nodes: The thoracic inlet demonstrates bilateral calcified thyroid nodules. The largest of these lies on the right measuring approximately 2.5 cm in greatest dimension. These are stable from a prior  exam from 2014. Fluid level is noted within the esophagus which may be related to some distal narrowing. No significant hilar or mediastinal adenopathy is noted. Lungs/Pleura: Mild dependent atelectasis is noted in the lower lobes. Small bilateral pleural effusions are seen. In the left lower lobe abutting the major fissure, there is a 6.0 by 3.7 by 5.5 cm soft tissue mass lesion. On the sagittal reconstructions, the lesion appears to extend across the minor fissure into the upper lobe. It envelops branches of the left lower lobe pulmonary artery. There is also impingement upon the lower lobe bronchial tree. These changes are consistent with primary pulmonary neoplasm. Upper Abdomen: Within normal limits. Musculoskeletal: Degenerative changes of the thoracic spine are noted. 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 Electronically Signed   By: MInez CatalinaM.D.   On: 01/01/2016 12:39   Dg Chest Port 1 View  Result Date: 01/01/2016 CLINICAL DATA:  Altered mental status, recent altered mental status EXAM: PORTABLE CHEST 1 VIEW COMPARISON:  02/10/2005 FINDINGS: Cardiac shadow is mildly enlarged. There is 7 cm mass lesions superimposed over the left hilar region new from the prior exam. No bony abnormality is seen. IMPRESSION: Changes consistent with a large left lung mass. CT of the chest is recommended for further evaluation. Electronically Signed   By: MInez CatalinaM.D.   On: 01/01/2016 11:08        Scheduled Meds: . busPIRone  7.5 mg Oral BID  . carvedilol  12.5 mg Oral BID  . citalopram  40 mg Oral Daily  . dexamethasone  4 mg Intravenous Q6H  . heparin  5,000 Units Subcutaneous Q8H  . insulin aspart  0-9 Units Subcutaneous Q4H  . pantoprazole  40 mg Oral Daily  . simvastatin  20 mg Oral QPM  . sodium chloride flush  3 mL Intravenous Q12H   Continuous  Infusions:    LOS: 2 days    Time spent: 25 minutes    Newman Pies, MD [facility] Pager [phone]  If 7PM-7AM, please contact night-coverage [email or web address] Password [facility] 01/03/2016, 7:45 AM

## 2016-01-03 NOTE — Progress Notes (Signed)
Pt has been attempted for a Brain Lab Protocol Mri Brain twice. The second time pt was sedated. Both attempts the quality needed to make useful Brain lab worthy images was unobtainable. Pt is too confused to cooperate. Please let MRI know how and when  we can be of further assistance.  Lynelle Doctor RT,R,MR  Ext [phone]

## 2016-01-03 NOTE — Progress Notes (Signed)
Dr Diamantina Monks has been paged to ensure he is aware that MRI has not been able to be obtained per the progress note filed by myself this am. Detailed message and [phone] left with answerer ing service @ 1358; just received call ; DR Diamantina Monks to talk with Medicine service to  see about arranging MRI with [place].

## 2016-01-03 NOTE — Progress Notes (Signed)
Unable to obtain second attempt MRI. 0.5 mg ativan given just before departure to MRI. Pt wanted to lay on her side and confused. NP notified.

## 2016-01-04 ENCOUNTER — Encounter (HOSPITAL_COMMUNITY): Payer: Self-pay

## 2016-01-04 ENCOUNTER — Inpatient Hospital Stay (HOSPITAL_COMMUNITY): Payer: Medicare Other

## 2016-01-04 LAB — GLUCOSE, CAPILLARY
GLUCOSE-CAPILLARY: 105 mg/dL — AB (ref 65–99)
GLUCOSE-CAPILLARY: 143 mg/dL — AB (ref 65–99)
GLUCOSE-CAPILLARY: 165 mg/dL — AB (ref 65–99)
Glucose-Capillary: 106 mg/dL — ABNORMAL HIGH (ref 65–99)
Glucose-Capillary: 125 mg/dL — ABNORMAL HIGH (ref 65–99)
Glucose-Capillary: 146 mg/dL — ABNORMAL HIGH (ref 65–99)
Glucose-Capillary: 98 mg/dL (ref 65–99)

## 2016-01-04 LAB — HEMOGLOBIN A1C
HEMOGLOBIN A1C: 6.4 % — AB (ref 4.8–5.6)
MEAN PLASMA GLUCOSE: 137 mg/dL

## 2016-01-04 MED ORDER — MAGNESIUM CHLORIDE 64 MG PO TBEC
1.0000 | DELAYED_RELEASE_TABLET | Freq: Every day | ORAL | Status: DC
  Administered 2016-01-04 – 2016-01-09 (×6): 64 mg via ORAL
  Filled 2016-01-04 (×6): qty 1

## 2016-01-04 NOTE — Care Management Note (Signed)
Case Management Note  Patient Details  Name: Alexis Buck MRN: [id number] Date of Birth: 1940-05-29  Subjective/Objective:  Met with pt, grandson, and son @ bedside.  PT/OT recommend CIR.  Discussed CIR vs ST-SNF for rehab and family agreeable to both options, grandson states he will arrange for her to have 24/7 assistance when ready for discharge. Ultimately, disposition will depend on results of MRI and recommended treatment.                Expected Discharge Plan:  IP Rehab Facility  In-House Referral:  Clinical Social Work  Discharge planning Services  CM Consult  Status of Service:  In process, will continue to follow  Girard Cooter, RN 01/04/2016, 3:27 PM

## 2016-01-04 NOTE — Evaluation (Signed)
Occupational Therapy Evaluation Patient Details Name: Alexis Buck MRN: [id number] DOB: 06/09/1940 Today's Date: 01/04/2016    History of Present Illness 75 y.o. female with medical history significant for tobacco abuse, hypertension, and GERD who presented for evaluation of altered mental status. Patient had reportedly been complaining of difficulty focusing and vision changes going back approximately 6 weeks. Also over the past 6 weeks, her daughter is noted episodes in which the patient appears to "stare off." Patient was found to be sitting on the floor after an apparent fall on day of admission. She was more confused than she had been, asking nonsensical questions, and was taken to the [facility] emergency department for evaluation. In the ED chest x-ray featured a conspicuous mass in the left lung. CT of the head and chest were obtained, revealing vasogenic edema surrounding a brain mass in the right parieto-occipital region, suspected secondary to lung primary, and with interval increase in the ventricular system suggestive of developing hydrocephalus. Neurosurgery was consulted by the ED physician and advised a medical admission to [facility]. Patient was then transferred to [facility].   Clinical Impression   Pt admitted with above. She demonstrates the below listed deficits and will benefit from continued OT to maximize safety and independence with BADLs.  Pt demonstrates Lt inattention, impaired cognition, impaired balance, perceptual deficits.  She currently requires min A for ADLs.  She will require 24 hour supervision/assist at discharge.  Recommend CIR with transition to ALF.  Will follow acutely.        Follow Up Recommendations  CIR;Supervision/Assistance - 24 hour    Equipment Recommendations  None recommended by OT    Recommendations for Other Services Rehab consult     Precautions / Restrictions Precautions Precautions: Fall      Mobility Bed  Mobility Overal bed mobility: Needs Assistance Bed Mobility: Supine to Sit;Sit to Supine     Supine to sit: Supervision Sit to supine: Supervision      Transfers Overall transfer level: Needs assistance Equipment used: None Transfers: Sit to/from [facility] Sit to Stand: Min guard Stand pivot transfers: Min assist            Balance                                            ADL Overall ADL's : Needs assistance/impaired Eating/Feeding: Minimal assistance;Sitting   Grooming: Wash/dry hands;Wash/dry face;Min guard;Sitting Grooming Details (indicate cue type and reason): min cues due to perseveration  Upper Body Bathing: Minimal assitance;Sitting Upper Body Bathing Details (indicate cue type and reason): requires cues and assist for sequencing and due to perseveration  Lower Body Bathing: Minimal assistance;Sit to/from stand Lower Body Bathing Details (indicate cue type and reason): requires cues and assist due to perseveration and sequencing deficits  Upper Body Dressing : Maximal assistance;Sitting Upper Body Dressing Details (indicate cue type and reason): Pt wads sleeve up when attempting to thread Lt UE through sleeve.  Poor awareness of Lt UE or deficits.  Lower Body Dressing: Maximal assistance;Sit to/from stand Lower Body Dressing Details (indicate cue type and reason): Pt repetitively attempts to turn socks inside out while donning them. Unable to self correct and perseveration on error noted  Toilet Transfer: Moderate assistance;Stand-pivot;BSC   Toileting- Clothing Manipulation and Hygiene: Minimal assistance;Sit to/from stand       Functional mobility during ADLs:  Moderate assistance General ADL Comments: Pt performed sponge bath while EOB.  She fatigued by end of eval      Vision Additional Comments: Pt with difficulty participating in formal assessment.  Lt inattention noted.  She is noted to see items in Lt visual field  that are not present.   She mistook my gloved hand for smoke .  Grabs for items that are not there.  Mod cues to locate items on Lt consistentl - this worsens as she fatigues    Perception Perception Perception Tested?: Yes Perception Deficits: Inattention/neglect Inattention/Neglect: Does not attend to left visual field;Does not attend to left side of body Spatial deficits: Lt inattention noted    Praxis Praxis Praxis tested?: Deficits Deficits: Initiation;Perseveration;Organization    Pertinent Vitals/Pain Pain Assessment: No/denies pain     Hand Dominance Right   Extremity/Trunk Assessment Upper Extremity Assessment Upper Extremity Assessment: LUE deficits/detail LUE Deficits / Details: ROM and strength WFL.  Pt with Lt body inattention  LUE Coordination: decreased gross motor;decreased fine motor   Lower Extremity Assessment Lower Extremity Assessment: Defer to PT evaluation   Cervical / Trunk Assessment Cervical / Trunk Assessment: Normal   Communication Communication Communication: HOH   Cognition Arousal/Alertness: Awake/alert Behavior During Therapy: WFL for tasks assessed/performed Overall Cognitive Status: Impaired/Different from baseline Area of Impairment: Orientation;Attention;Memory;Following commands;Safety/judgement;Problem solving Orientation Level: Disoriented to;Place;Time;Situation Current Attention Level: Sustained Memory: Decreased short-term memory Following Commands: Follows one step commands consistently Safety/Judgement: Decreased awareness of safety;Decreased awareness of deficits Awareness: Intellectual Problem Solving: Slow processing;Decreased initiation;Difficulty sequencing;Requires verbal cues;Requires tactile cues General Comments: Motor perseveration noted.  Pt with significant perceptual deficits in addition to cognitive deficits.     General Comments       Exercises       Shoulder Instructions      Home Living Family/patient  expects to be discharged to:: Unsure Living Arrangements: Alone Available Help at Discharge: Family;Available 24 hours/day Type of Home: Apartment Home Access: Elevator     Home Layout: One level     Bathroom Shower/Tub: Teacher, early years/pre: Standard     Home Equipment: Cane - single point;Shower seat;Hand held shower head          Prior Functioning/Environment Level of Independence: Independent        Comments: Pt enjoys baking     OT Diagnosis: Generalized weakness;Cognitive deficits;Disturbance of vision   OT Problem List: Decreased strength;Decreased activity tolerance;Impaired balance (sitting and/or standing);Impaired vision/perception;Decreased coordination;Decreased cognition;Decreased safety awareness;Decreased knowledge of use of DME or AE;Decreased knowledge of precautions;Impaired sensation;Impaired UE functional use   OT Treatment/Interventions: Self-care/ADL training;Neuromuscular education;DME and/or AE instruction;Therapeutic activities;Cognitive remediation/compensation;Visual/perceptual remediation/compensation;Patient/family education;Balance training    OT Goals(Current goals can be found in the care plan section) Acute Rehab OT Goals Patient Stated Goal: Pt did not state  OT Goal Formulation: With patient Time For Goal Achievement: 01/18/16 Potential to Achieve Goals: Good ADL Goals Pt Will Perform Grooming: with min guard assist;standing Pt Will Perform Upper Body Bathing: with supervision;sitting Pt Will Perform Lower Body Bathing: with min guard assist;sit to/from stand Pt Will Perform Upper Body Dressing: with mod assist;sitting Pt Will Perform Lower Body Dressing: with mod assist;sit to/from stand Pt Will Transfer to Toilet: with min guard assist;ambulating;regular height toilet;bedside commode;grab bars Pt Will Perform Toileting - Clothing Manipulation and hygiene: with min guard assist;sit to/from stand Additional ADL Goal #1:  Pt will locate items consistently on left with min cues   OT Frequency: Min 2X/week   Barriers  to D/C: Decreased caregiver support          Co-evaluation              End of Session Equipment Utilized During Treatment: Oxygen Nurse Communication: Mobility status  Activity Tolerance: Patient tolerated treatment well Patient left: in bed;with call bell/phone within reach;with bed alarm set;with family/visitor present   Time: 5003-7048 OT Time Calculation (min): 38 min Charges:  OT General Charges $OT Visit: 1 Procedure OT Evaluation $OT Eval Moderate Complexity: 1 Procedure OT Treatments $Self Care/Home Management : 23-37 mins G-Codes:    Devra Stare M 01-28-2016, 1:09 PM

## 2016-01-04 NOTE — Progress Notes (Signed)
Speech Language Pathology Treatment: Dysphagia  Patient Details Name: Alexis Buck MRN: [id number] DOB: May 17, 1941 Today's Date: 01/04/2016 Time: 5277-8242 SLP Time Calculation (min) (ACUTE ONLY): 19 min  Assessment / Plan / Recommendation Clinical Impression  F/u diet tolerance assessment revealed a normal oropharyngeal swallow via self feeding with independent use of safe swallowing strategies. Overall, patient appears to be tolerating current diet which remains appropriate given lack of dentures. No f/u for dysphagia indicated however continue to recommend cognitive-linguistic evaluation given noted memory and sustained attention impairments which family reports are new this admission.    HPI HPI: 75 y.o. female with medical history significant for tobacco abuse, hypertension, and GERD who presented for evaluation of altered mental status. Patient had reportedly been complaining of difficulty focusing and vision changes going back approximately 6 weeks. Also over the past 6 weeks, her daughter is noted episodes in which the patient appears to "stare off." Patient was found  to be sitting on the floor after an apparent fall on day of admission. She was more confused than she had been, asking nonsensical questions, and was taken to the [facility] emergency department for evaluation.  In the ED chest x-ray featured a conspicuous mass in the left lung. CT of the head and chest were obtained, revealing vasogenic edema surrounding a brain mass in the right parieto-occipital region, suspected secondary to lung primary, and with interval increase in the ventricular system suggestive of developing hydrocephalus. Neurosurgery was consulted by the ED physician and advised a medical admission to [facility].  Patient was then transferred to [place]  All goals met     Recommendations  Diet recommendations: Dysphagia 3 (mechanical soft);Thin liquid (patient  endentulous) Liquids provided via: Cup;Straw Medication Administration: Whole meds with liquid Supervision: Patient able to self feed Postural Changes and/or Swallow Maneuvers: Seated upright 90 degrees             Oral Care Recommendations: Oral care BID Follow up Recommendations: Other (comment) (none for dysphagia) Plan: All goals met     GO              Gabriel Rainwater MA, CCC-SLP [phone]   Gabriel Rainwater [name] 01/04/2016, 3:49 PM

## 2016-01-04 NOTE — NC FL2 (Signed)
[place] LEVEL OF CARE SCREENING TOOL     IDENTIFICATION  Patient Name: Alexis Buck Birthdate: February 24, 1941 Sex: female Admission Date (Current Location): 01/01/2016  [facility] and [place] Number:  Engineering geologist and Address:  [facility], [place] [address] [place], [place] [address]      Provider Number: [id number]  Attending Physician Name and Address:  Wallis Bamberg, MD  Relative Name and Phone Number:       Current Level of Care: Hospital Recommended Level of Care: [place] Prior Approval Number:    Date Approved/Denied:   PASRR Number: [id number]  Discharge Plan: SNF    Current Diagnoses: Patient Active Problem List   Diagnosis Date Noted  . Brain tumor ([place])   . Essential hypertension 01/01/2016  . Lung mass 01/01/2016  . Brain metastasis ([place]) 01/01/2016  . Dehydration 01/01/2016  . Altered mental status 01/01/2016  . Kidney disease 01/01/2016  . Hypokalemia 01/01/2016  . Leukocytosis 01/01/2016  . Hypercalcemia 01/01/2016    Orientation RESPIRATION BLADDER Height & Weight     Self, Time, Place  O2 (2L [place]) Continent Weight: 73.9 kg (162 lb 14.4 oz) Height:  '4\' 11"'$  (149.9 cm)  BEHAVIORAL SYMPTOMS/MOOD NEUROLOGICAL BOWEL NUTRITION STATUS      Continent Diet (see DC summary)  AMBULATORY STATUS COMMUNICATION OF NEEDS Skin   Limited Assist Verbally Normal                       Personal Care Assistance Level of Assistance  Bathing, Dressing Bathing Assistance: Limited assistance   Dressing Assistance: Limited assistance     Functional Limitations Info             SPECIAL CARE FACTORS FREQUENCY  PT (By licensed PT), OT (By licensed OT)     PT Frequency: 5/wk OT Frequency: 5/wk            Contractures      Additional Factors Info  Code Status, Allergies, Insulin Sliding Scale Code Status Info: FULL Allergies Info: NKA   Insulin Sliding Scale Info: 6/day        Current Medications (01/04/2016):  This is the current hospital active medication list Current Facility-Administered Medications  Medication Dose Route Frequency Provider Last Rate Last Dose  . acetaminophen (TYLENOL) tablet 650 mg  650 mg Oral Q6H PRN Vianne Bulls, MD       Or  . acetaminophen (TYLENOL) suppository 650 mg  650 mg Rectal Q6H PRN Vianne Bulls, MD      . bisacodyl (DULCOLAX) EC tablet 5 mg  5 mg Oral Daily PRN Vianne Bulls, MD      . busPIRone (BUSPAR) tablet 7.5 mg  7.5 mg Oral BID Ilene Qua Opyd, MD   7.5 mg at 01/04/16 1021  . carvedilol (COREG) tablet 12.5 mg  12.5 mg Oral BID Ilene Qua Opyd, MD   12.5 mg at 01/04/16 1022  . citalopram (CELEXA) tablet 40 mg  40 mg Oral Daily Vianne Bulls, MD   40 mg at 01/04/16 1027  . dexamethasone (DECADRON) injection 4 mg  4 mg Intravenous Q6H Vianne Bulls, MD   4 mg at 01/04/16 1306  . heparin injection 5,000 Units  5,000 Units Subcutaneous Q8H Vianne Bulls, MD   5,000 Units at 01/04/16 1307  . HYDROcodone-acetaminophen (NORCO/VICODIN) 5-325 MG per tablet 1-2 tablet  1-2 tablet Oral Q4H PRN Vianne Bulls, MD  1 tablet at 01/03/16 2103  . insulin aspart (novoLOG) injection 0-9 Units  0-9 Units Subcutaneous Q4H Vianne Bulls, MD   1 Units at 01/04/16 1306  . ipratropium-albuterol (DUONEB) 0.5-2.5 (3) MG/3ML nebulizer solution 3 mL  3 mL Nebulization Q4H PRN Ilene Qua Opyd, MD      . magnesium chloride (SLOW-MAG) 64 MG SR tablet 64 mg  1 tablet Oral Daily Wallis Bamberg, MD   64 mg at 01/04/16 1022  . ondansetron (ZOFRAN) tablet 4 mg  4 mg Oral Q6H PRN Vianne Bulls, MD       Or  . ondansetron (ZOFRAN) injection 4 mg  4 mg Intravenous Q6H PRN Ilene Qua Opyd, MD      . pantoprazole (PROTONIX) EC tablet 40 mg  40 mg Oral Daily Vianne Bulls, MD   40 mg at 01/04/16 1022  . polyethylene glycol (MIRALAX / GLYCOLAX) packet 17 g  17 g Oral Daily PRN Vianne Bulls, MD      . simvastatin (ZOCOR) tablet 20 mg  20 mg Oral QPM  Vianne Bulls, MD   20 mg at 01/03/16 1820  . sodium chloride flush (NS) 0.9 % injection 3 mL  3 mL Intravenous Q12H Vianne Bulls, MD   3 mL at 01/04/16 1031     Discharge Medications: Please see discharge summary for a list of discharge medications.  Relevant Imaging Results:  Relevant Lab Results:   Additional Information SS#: [id number]  Jorge Ny, LCSW

## 2016-01-04 NOTE — Progress Notes (Signed)
PROGRESS NOTE    Alexis Buck  VOZ:[id number] DOB: 1941-03-04 DOA: 01/01/2016 PCP: Lavera Guise, MD   Brief Narrative:  Alexis Buck a 75 y.o.femalewith medical history significant for tobacco abuse, hypertension, and GERD who presented for evaluation of altered mental status. Patient had reportedly been complaining of difficulty focusing and vision changes going back approximately 6 weeks. Also over the past 6 weeks, her daughter is noted episodes in which the patient appears to "stare off." Patient was found  to be sitting on the floor after an apparent fall on day of admission. She was more confused than she had been, asking nonsensical questions, and was taken to the [facility] emergency department for evaluation.  In the ED chest x-ray featured a conspicuous mass in the left lung. CT of the head and chest were obtained, revealing vasogenic edema surrounding a brain mass in the right parieto-occipital region, suspected secondary to lung primary, and with interval increase in the ventricular system suggestive of developing hydrocephalus. Neurosurgery was consulted by the ED physician and advised a medical admission to [facility].  Patient was then transferred to [facility] for evaluation by neurosurgery.   Assessment & Plan:   Principal Problem:   Dehydration Active Problems:   Essential hypertension   Lung mass   Brain metastasis (HCC)   Altered mental status   Kidney disease   Hypokalemia   Leukocytosis   Hypercalcemia   Brain tumor (HCC)   Brain and lung masses  - Per radiology, large left lung mass appears to be primary, and brain mass a met - Neurosurgery is consulting and much appreciated - Decadron 10 mg IV given on admission for vasogenic brain edema; plan to follow with 4 mg q6h  - MRI attempted twice but patient unable to stay still for an adequate study - awaiting Neurosurgery recommendations  Altered mental status  - Secondary to brain mass,  suspected to represent a metastasis from lung  - Awaiting consultant recommendations  - Alert and oriented to person, place (still difficult time orienting to situation and time) - case management and social work consults placed as daughter expresses she will need help with placement when time for d/c as patient is not safe to discharge home alone  Kidney disease - SCr 1.83 on admission with no prior available for comparison  - Suspect there is an acute component given recent poor oral intake and s/s of dehydration  - Patient having good PO intake - will watch UOP - Cr on 8/13 was 1.04  Hypokalemia and Hypomagnesemia  - Serum potassium 2.7 on admission  - Likely secondary to HCTZ, will hold   - Magnesium level low; 2g IV given at start of admission  - Monitor on telemetry  - will order Mag tab daily - repeat potassium on 8/13 was 3.3  Hypercalcemia  - Serum calcium 11.7 on admission - Suspected secondary to malignant process  - Continue rehydration with IVF  - Repeat calcium on 8/13 was 9.6  Leukocytosis  - WBC 24,500 on admission with neutrophilic predominance  - Suspect this is secondary to the malignant process rather than infection  - Blood and urine cultures are incubating (blood cx- no growth x 2 days; urine culture- no growth) - Watching off of abx for now  - patient afebrile, not consistently tachycardic (HR during exam was 60s-90s), not hypotensive  Hypertension - At goal currently - Managed with Coreg and HCTZ at home  - Hold HCTZ 2/2 hypokalemia -  continue Coreg with monitoring  - blood pressures well controlled   DVT prophylaxis: heparin  Code Status: full Family Communication: daughteris bedside, discussed plan with him  Disposition Plan: unclear at this time   Consultants:   Neurosurgery  Procedures:   none  Antimicrobials:   none    Subjective: Patient very cheerful and talkative this morning.  She said she had a good night which her  daughter supports.  Patient is still unable to say why she is here but says her vision is improved from time of admission.  She has been getting up to use the commode.  Daughter voices concern about discharge planning and that she does not believe patient will be safe to discharge home to live alone as she did previously.    Objective: Vitals:   01/04/16 0400 01/04/16 0455 01/04/16 0600 01/04/16 0845  BP: (!) 113/92 (!) 113/92  (!) 140/95  Pulse: 68 71 73 65  Resp: 16 16 16  (!) 21  Temp:  98.2 F (36.8 C)  97.6 F (36.4 C)  TempSrc:  Oral  Oral  SpO2: 95% 93% 96% 95%  Weight:      Height:        Intake/Output Summary (Last 24 hours) at 01/04/16 0922 Last data filed at 01/04/16 0400  Gross per 24 hour  Intake              480 ml  Output             1850 ml  Net            -1370 ml   Filed Weights   01/01/16 2359 01/02/16 0429 01/03/16 0449  Weight: 73.2 kg (161 lb 6 oz) 73.2 kg (161 lb 6 oz) 73.9 kg (162 lb 14.4 oz)    Examination:  General exam: Appears calm and comfortable  Respiratory system: Clear to auscultation. Respiratory effort normal. Cardiovascular system: S1 & S2 heard, RRR. No JVD, murmurs, rubs, gallops or clicks. No pedal edema. Gastrointestinal system: Abdomen is nondistended, soft and nontender. No organomegaly or masses felt. Normal bowel sounds heard. Central nervous system: Alert and oriented to person and place.  Patient orientation to time closer than previously (states it is 2016 but still states Alexis Buck is president) patient says she is in the hospital because her vision was poor. CN II-XII were grossly intact.  No focal deficits noted Extremities: Symmetric 5 x 5 power in upper and lower extremities bilaterally Skin: No rashes, lesions or ulcers Psychiatry: Judgement at times appears normal. Mood & affect appropriate.     Data Reviewed: I have personally reviewed following labs and imaging studies  CBC:  Recent Labs Lab 01/01/16 1047 01/02/16 0025  01/02/16 0805  WBC 24.5* 16.1* 16.7*  NEUTROABS 22.3*  --  15.8*  HGB 18.6* 14.0 14.3  HCT 55.2* 40.0 41.6  MCV 90.9 88.5 89.3  PLT 368 259 893   Basic Metabolic Panel:  Recent Labs Lab 01/01/16 1047 01/01/16 2206 01/02/16 0025 01/02/16 0805 01/03/16 1451  NA 140  --  135 137 133*  K 2.7*  --  3.0* 3.3* 3.3*  CL 93*  --  98* 100* 97*  CO2 31  --  27 26 27   GLUCOSE 181*  --  117* 95 103*  BUN 37*  --  31* 29* 29*  CREATININE 1.83*  --  1.28* 1.22* 1.04*  CALCIUM 11.7* 10.1 9.4 9.4 9.6  MG  --  1.6*  --   --   --  GFR: Estimated Creatinine Clearance: 40.3 mL/min (by C-G formula based on SCr of 1.04 mg/dL). Liver Function Tests:  Recent Labs Lab 01/01/16 1047 01/02/16 0025 01/02/16 0805  AST 34 21 26  ALT 22 16 18   ALKPHOS 69 46 56  BILITOT 1.2 1.0 0.7  PROT 7.0 5.0* 5.7*  ALBUMIN 3.2* 2.2* 2.4*   No results for input(s): LIPASE, AMYLASE in the last 168 hours. No results for input(s): AMMONIA in the last 168 hours. Coagulation Profile:  Recent Labs Lab 01/02/16 0025  INR 1.32   Cardiac Enzymes: No results for input(s): CKTOTAL, CKMB, CKMBINDEX, TROPONINI in the last 168 hours. BNP (last 3 results) No results for input(s): PROBNP in the last 8760 hours. HbA1C:  Recent Labs  01/01/16 2215  HGBA1C 6.4*   CBG:  Recent Labs Lab 01/03/16 1539 01/03/16 1941 01/03/16 2343 01/04/16 0356 01/04/16 0841  GLUCAP 113* 157* 98 106* 105*   Lipid Profile: No results for input(s): CHOL, HDL, LDLCALC, TRIG, CHOLHDL, LDLDIRECT in the last 72 hours. Thyroid Function Tests:  Recent Labs  01/01/16 2215  TSH 0.392   Anemia Panel: No results for input(s): VITAMINB12, FOLATE, FERRITIN, TIBC, IRON, RETICCTPCT in the last 72 hours. Sepsis Labs:  Recent Labs Lab 01/01/16 1047 01/01/16 1718 01/01/16 2210 01/02/16 0025  LATICACIDVEN 3.3* 2.1* 1.5 1.0    Recent Results (from the past 240 hour(s))  Blood Culture (routine x 2)     Status: None  (Preliminary result)   Collection Time: 01/01/16 10:47 AM  Result Value Ref Range Status   Specimen Description BLOOD RIGHT ANTECUBITAL  Final   Special Requests BOTTLES DRAWN AEROBIC AND ANAEROBIC  [place]  Final   Culture NO GROWTH 2 DAYS  Final   Report Status PENDING  Incomplete  Blood Culture (routine x 2)     Status: None (Preliminary result)   Collection Time: 01/01/16 10:47 AM  Result Value Ref Range Status   Specimen Description BLOOD LEFT HAND  Final   Special Requests BOTTLES DRAWN AEROBIC AND ANAEROBIC  3CC  Final   Culture NO GROWTH 2 DAYS  Final   Report Status PENDING  Incomplete  Urine culture     Status: None   Collection Time: 01/01/16 10:47 AM  Result Value Ref Range Status   Specimen Description URINE, RANDOM  Final   Special Requests NONE  Final   Culture NO GROWTH Performed at [facility]   Final   Report Status 01/02/2016 FINAL  Final  MRSA PCR Screening     Status: None   Collection Time: 01/01/16  8:14 PM  Result Value Ref Range Status   MRSA by PCR NEGATIVE NEGATIVE Final    Comment:        The GeneXpert MRSA Assay (FDA approved for NASAL specimens only), is one component of a comprehensive MRSA colonization surveillance program. It is not intended to diagnose MRSA infection nor to guide or monitor treatment for MRSA infections.          Radiology Studies: No results found.      Scheduled Meds: . busPIRone  7.5 mg Oral BID  . carvedilol  12.5 mg Oral BID  . citalopram  40 mg Oral Daily  . dexamethasone  4 mg Intravenous Q6H  . heparin  5,000 Units Subcutaneous Q8H  . insulin aspart  0-9 Units Subcutaneous Q4H  . pantoprazole  40 mg Oral Daily  . simvastatin  20 mg Oral QPM  . sodium chloride flush  3 mL Intravenous  Q12H   Continuous Infusions:    LOS: 3 days    Time spent: 30 minutes    Newman Pies, MD [facility] Pager [phone]  If 7PM-7AM, please contact night-coverage [email or web address] Password  [email or web address] 01/04/2016, 9:22 AM

## 2016-01-04 NOTE — Progress Notes (Signed)
MRI UNDER GENERAL ANESTHESIA IS SET UP FOR 01/05/16 @ 1pm, PT NEEDS TO BE NPO AT MIDNIGHT, MD TO BE NOTIFIED AND RN

## 2016-01-04 NOTE — Progress Notes (Signed)
Pt had an overall good night, rested comfortably with a couple of BSC assistance. Still confused but easy to redirect to current situation, breathing even and unlabored, no s/s of distress noted, will continue to monitor pt

## 2016-01-04 NOTE — Clinical Social Work Note (Signed)
Clinical Social Work Assessment  Patient Details  Name: Alexis Buck MRN: [id number] Date of Birth: 1941-03-22  Date of referral:  01/04/16               Reason for consult:  Facility Placement                Permission sought to share information with:  Chartered certified accountant granted to share information::  Yes, Verbal Permission Granted  Name::     Sales executive::  SNFs  Relationship::  dtr  Contact Information:     Housing/Transportation Living arrangements for the past 2 months:  Apartment Source of Information:  Patient, Adult Children, Friend/Neighbor Patient Interpreter Needed:  None Criminal Activity/Legal Involvement Pertinent to Current Situation/Hospitalization:  No - Comment as needed Significant Relationships:  Adult Children, Friend Lives with:  Self Do you feel safe going back to the place where you live?  No Need for family participation in patient care:  Yes (Comment) (decision making)  Care giving concerns:  Pt lives in an apartment by herself- currently needing more assistance than normal and has cognitive concerns due to progressive cancer.   Social Worker assessment / plan:  CSW spoke first with patient and patient friend at bedside.  Patient was responsive but was unable to complete sentences or give appropriate answers to most of the questioning.  Pt friend informed CSW that pt dtr is very involved- pt provided verbal permission for CSW to contact dtr regarding plan.  CSW spoke with dtr who confirms she is unable to care for patient in current state and is interested in SNF or CIR at [place].  Employment status:    Insurance informationEducational psychologist PT Recommendations:  Seba Dalkai / Referral to community resources:  [place]  Patient/Family's Response to care:  Pt pleasantly confused and agreeable to rehab stay- dtr is also in agreement.  Patient/Family's Understanding of and Emotional  Response to Diagnosis, Current Treatment, and Prognosis:  No questions at this time but dtr very overwhelmed by her moms condition and likely poor prognosis.  Emotional Assessment Appearance:  Appears stated age Attitude/Demeanor/Rapport:    Affect (typically observed):  Appropriate Orientation:  Oriented to Self, Oriented to Place, Oriented to  Time Alcohol / Substance use:  Not Applicable Psych involvement (Current and /or in the community):  No (Comment)  Discharge Needs  Concerns to be addressed:  Care Coordination Readmission within the last 30 days:  No Current discharge risk:  Physical Impairment Barriers to Discharge:  Continued Medical Work up   Jorge Ny, LCSW 01/04/2016, 4:56 PM

## 2016-01-04 NOTE — Progress Notes (Signed)
Physical Therapy Treatment Patient Details Name: Alexis Buck MRN: [id number] DOB: 1940/06/12 Today's Date: 01/04/2016    History of Present Illness 75 y.o. female with medical history significant for tobacco abuse, hypertension, and GERD who presented for evaluation of altered mental status. Patient had reportedly been complaining of difficulty focusing and vision changes going back approximately 6 weeks. Also over the past 6 weeks, her daughter is noted episodes in which the patient appears to "stare off." Patient was found to be sitting on the floor after an apparent fall on day of admission. She was more confused than she had been, asking nonsensical questions, and was taken to the [facility] emergency department for evaluation. In the ED chest x-ray featured a conspicuous mass in the left lung. CT of the head and chest were obtained, revealing vasogenic edema surrounding a brain mass in the right parieto-occipital region, suspected secondary to lung primary, and with interval increase in the ventricular system suggestive of developing hydrocephalus. Neurosurgery was consulted by the ED physician and advised a medical admission to [facility]. Patient was then transferred to [place]    Alexis Buck is very pleasant and agreeable to work with therapy.  She requires multimodal directional cues while ambulating and LOB x1 requiring min assist.  Pt is motivated and eager to be as independent as possible and has a very supportive family.  Continue to recommend CIR as most appropriate d/c plan.    Follow Up Recommendations  CIR     Equipment Recommendations  Rolling walker with 5" wheels    Recommendations for Other Services       Precautions / Restrictions Precautions Precautions: Fall Restrictions Weight Bearing Restrictions: No    Mobility  Bed Mobility Overal bed mobility: Needs Assistance Bed Mobility: Supine to Sit;Sit to Supine     Supine  to sit: Supervision Sit to supine: Supervision   General bed mobility comments: Pt sitting in recliner chair upon PT arrival  Transfers Overall transfer level: Needs assistance Equipment used: None Transfers: Sit to/from Stand Sit to Stand: Min guard Stand pivot transfers: Min assist       General transfer comment: Close min guard for safety  Ambulation/Gait Ambulation/Gait assistance: Min assist Ambulation Distance (Feet): 125 Feet Assistive device: None Gait Pattern/deviations: Step-through pattern;Decreased stride length Gait velocity: decreased   General Gait Details: Pt continues to require multimodal directional cues and unable to lead therapist back to her room.  LOB x1 when turning, requiring min assist.  Decreased gait speed.   Stairs            Wheelchair Mobility    Modified Rankin (Stroke Patients Only) Modified Rankin (Stroke Patients Only) Pre-Morbid Rankin Score: No significant disability Modified Rankin: Moderately severe disability     Balance Overall balance assessment: Needs assistance Sitting-balance support: No upper extremity supported;Feet supported Sitting balance-Leahy Scale: Good     Standing balance support: No upper extremity supported;During functional activity Standing balance-Leahy Scale: Fair                      Cognition Arousal/Alertness: Awake/alert Behavior During Therapy: WFL for tasks assessed/performed Overall Cognitive Status: Impaired/Different from baseline Area of Impairment: Orientation;Attention;Memory;Safety/judgement;Following commands;Awareness;Problem solving Orientation Level: Disoriented to;Place;Time;Situation Current Attention Level: Sustained Memory: Decreased short-term memory Following Commands: Follows one step commands inconsistently Safety/Judgement: Decreased awareness of safety;Decreased awareness of deficits Awareness: Intellectual Problem Solving: Slow processing;Decreased  initiation;Difficulty sequencing;Requires verbal cues;Requires tactile cues General Comments: Motor  perseveration noted.  Pt with significant perceptual deficits in addition to cognitive deficits.      Exercises General Exercises - Upper Extremity Shoulder Flexion: AROM;Both;10 reps;Seated General Exercises - Lower Extremity Long Arc Quad: AROM;Both;10 reps;Seated    General Comments General comments (skin integrity, edema, etc.): VSS      Pertinent Vitals/Pain Pain Assessment: No/denies pain    Home Living Family/patient expects to be discharged to:: Unsure Living Arrangements: Alone Available Help at Discharge: Family;Available 24 hours/day Type of Home: Apartment Home Access: Elevator   Home Layout: One level Home Equipment: Cane - single point;Shower seat;Hand held shower head      Prior Function Level of Independence: Independent      Comments: Pt enjoys baking    PT Goals (current goals can now be found in the care plan section) Acute Rehab PT Goals Patient Stated Goal: Pt did not state PT Goal Formulation: With patient/family Time For Goal Achievement: 01/16/16 Potential to Achieve Goals: Good Progress towards PT goals: Progressing toward goals    Frequency  Min 3X/week    PT Plan Current plan remains appropriate    Co-evaluation             End of Session Equipment Utilized During Treatment: Gait belt Activity Tolerance: Patient tolerated treatment well;Patient limited by fatigue Patient left: in chair;with call bell/phone within reach;with chair alarm set;with family/visitor present;Other (comment) (CM present)     Time: 7262-0355 PT Time Calculation (min) (ACUTE ONLY): 23 min  Charges:  $Gait Training: 23-37 mins                    G Codes:      Collie Siad PT, DPT  Pager: [phone] Phone: [phone] 01/04/2016, 3:29 PM

## 2016-01-05 ENCOUNTER — Inpatient Hospital Stay (HOSPITAL_COMMUNITY): Payer: Medicare Other | Admitting: Anesthesiology

## 2016-01-05 ENCOUNTER — Inpatient Hospital Stay (HOSPITAL_COMMUNITY): Payer: Medicare Other

## 2016-01-05 ENCOUNTER — Encounter (HOSPITAL_COMMUNITY)
Admission: AD | Disposition: A | Discharge status: Skilled Nursing Facility | Payer: Self-pay | Source: Other Acute Inpatient Hospital | Attending: Family Medicine

## 2016-01-05 DIAGNOSIS — R410 Disorientation, unspecified: Secondary | ICD-10-CM

## 2016-01-05 HISTORY — PX: RADIOLOGY WITH ANESTHESIA: SHX6223

## 2016-01-05 LAB — CBC WITH DIFFERENTIAL/PLATELET
BASOS ABS: 0 10*3/uL (ref 0.0–0.1)
BASOS PCT: 0 %
EOS ABS: 0 10*3/uL (ref 0.0–0.7)
Eosinophils Relative: 0 %
HEMATOCRIT: 43.6 % (ref 36.0–46.0)
HEMOGLOBIN: 15 g/dL (ref 12.0–15.0)
Lymphocytes Relative: 3 %
Lymphs Abs: 0.5 10*3/uL — ABNORMAL LOW (ref 0.7–4.0)
MCH: 31.1 pg (ref 26.0–34.0)
MCHC: 34.4 g/dL (ref 30.0–36.0)
MCV: 90.3 fL (ref 78.0–100.0)
Monocytes Absolute: 0.4 10*3/uL (ref 0.1–1.0)
Monocytes Relative: 3 %
NEUTROS ABS: 14.4 10*3/uL — AB (ref 1.7–7.7)
NEUTROS PCT: 94 %
Platelets: 317 10*3/uL (ref 150–400)
RBC: 4.83 MIL/uL (ref 3.87–5.11)
RDW: 14.6 % (ref 11.5–15.5)
WBC: 15.3 10*3/uL — ABNORMAL HIGH (ref 4.0–10.5)

## 2016-01-05 LAB — COMPREHENSIVE METABOLIC PANEL
ALT: 30 U/L (ref 14–54)
ANION GAP: 9 (ref 5–15)
AST: 27 U/L (ref 15–41)
Albumin: 2.4 g/dL — ABNORMAL LOW (ref 3.5–5.0)
Alkaline Phosphatase: 48 U/L (ref 38–126)
BILIRUBIN TOTAL: 0.6 mg/dL (ref 0.3–1.2)
BUN: 21 mg/dL — ABNORMAL HIGH (ref 6–20)
CHLORIDE: 98 mmol/L — AB (ref 101–111)
CO2: 29 mmol/L (ref 22–32)
Calcium: 9.1 mg/dL (ref 8.9–10.3)
Creatinine, Ser: 0.91 mg/dL (ref 0.44–1.00)
GFR, EST NON AFRICAN AMERICAN: 60 mL/min — AB (ref >60)
Glucose, Bld: 100 mg/dL — ABNORMAL HIGH (ref 65–99)
POTASSIUM: 3.9 mmol/L (ref 3.5–5.1)
Sodium: 136 mmol/L (ref 135–145)
TOTAL PROTEIN: 5.5 g/dL — AB (ref 6.5–8.1)

## 2016-01-05 LAB — GLUCOSE, CAPILLARY
GLUCOSE-CAPILLARY: 115 mg/dL — AB (ref 65–99)
GLUCOSE-CAPILLARY: 114 mg/dL — AB (ref 65–99)
GLUCOSE-CAPILLARY: 123 mg/dL — AB (ref 65–99)
GLUCOSE-CAPILLARY: 100 mg/dL — AB (ref 65–99)
GLUCOSE-CAPILLARY: 180 mg/dL — AB (ref 65–99)
GLUCOSE-CAPILLARY: 149 mg/dL — AB (ref 65–99)

## 2016-01-05 SURGERY — RADIOLOGY WITH ANESTHESIA
Anesthesia: General

## 2016-01-05 MED ORDER — CITALOPRAM HYDROBROMIDE 20 MG PO TABS
20.0000 mg | ORAL_TABLET | Freq: Every day | ORAL | Status: DC
  Administered 2016-01-05 – 2016-01-09 (×5): 20 mg via ORAL
  Filled 2016-01-05 (×5): qty 1

## 2016-01-05 MED ORDER — GADOBENATE DIMEGLUMINE 529 MG/ML IV SOLN
15.0000 mL | Freq: Once | INTRAVENOUS | Status: AC | PRN
  Administered 2016-01-05: 15 mL via INTRAVENOUS

## 2016-01-05 MED ORDER — SUCCINYLCHOLINE CHLORIDE 20 MG/ML IJ SOLN
INTRAMUSCULAR | Status: DC | PRN
  Administered 2016-01-05: 80 mg via INTRAVENOUS

## 2016-01-05 MED ORDER — LIDOCAINE HCL (CARDIAC) 20 MG/ML IV SOLN
INTRAVENOUS | Status: DC | PRN
  Administered 2016-01-05: 80 mg via INTRAVENOUS

## 2016-01-05 MED ORDER — LACTATED RINGERS IV SOLN
INTRAVENOUS | Status: DC
  Administered 2016-01-05 – 2016-01-09 (×4): via INTRAVENOUS

## 2016-01-05 MED ORDER — PROPOFOL 10 MG/ML IV BOLUS
INTRAVENOUS | Status: DC | PRN
  Administered 2016-01-05: 140 mg via INTRAVENOUS

## 2016-01-05 NOTE — Transfer of Care (Signed)
Immediate Anesthesia Transfer of Care Note  Patient: Alexis Buck  Procedure(s) Performed: Procedure(s): MRI BRAIN WITH AND WITHOUT (N/A)  Patient Location: PACU  Anesthesia Type:General  Level of Consciousness: awake and alert   Airway & Oxygen Therapy: Patient Spontanous Breathing and Patient connected to nasal cannula oxygen  Post-op Assessment: Report given to RN and Post -op Vital signs reviewed and stable  Post vital signs: Reviewed and stable  Last Vitals:  Vitals:   01/05/16 0815 01/05/16 1215  BP:  (!) (P) 139/96  Pulse:  (P) 69  Resp:  (!) (P) 21  Temp: 36.9 C (P) 36.2 C    Last Pain:  Vitals:   01/05/16 0815  TempSrc: Oral  PainSc:       Patients Stated Pain Goal: 0 (47/82/95 [id number])  Complications: No apparent anesthesia complications

## 2016-01-05 NOTE — Progress Notes (Signed)
Saw patient when she returned from MRI.  She is awake and talking/ joking with family.  MRI showed 4 cm mass in the right occipital lobe with internal necrosis. Vasogenic edema affecting the right hemisphere. Right-to-left shift of 3 mm. In the setting of what looks like a primary lung cancer, metastatic disease is the most likely diagnosis. Lesion could represent a primary brain malignancy, but that is less likely.  I called Dr. Cleotilde Neer office to discuss results at [phone].  Left a message with patient's nurse with my cell phone number to discuss treatment plan.  At this time patient is alert, awake, shows no focal deficits, moves arms and legs equal bilaterally.  Will follow neurosurgery recommendations.

## 2016-01-05 NOTE — Anesthesia Preprocedure Evaluation (Addendum)
Anesthesia Evaluation  Patient identified by MRN, date of birth, ID band Patient awake    Reviewed: Allergy & Precautions, NPO status , Patient's Chart, lab work & pertinent test results  Airway Mallampati: II  TM Distance: >3 FB Neck ROM: Full   Comment: Patient refused to open mouth due to mental state Dental  (+) Upper Dentures, Lower Dentures   Pulmonary Current Smoker,  Suspect lung cancer with mets to the brain   Pulmonary exam normal        Cardiovascular hypertension, Normal cardiovascular exam     Neuro/Psych negative neurological ROS  negative psych ROS   GI/Hepatic negative GI ROS,   Endo/Other  negative endocrine ROS  Renal/GU CRFRenal diseaseHypokalemia, under replacement     Musculoskeletal negative musculoskeletal ROS (+)   Abdominal   Peds negative pediatric ROS (+)  Hematology negative hematology ROS (+)   Anesthesia Other Findings   Reproductive/Obstetrics                           Anesthesia Physical Anesthesia Plan  ASA: III  Anesthesia Plan: General   Post-op Pain Management:    Induction: Intravenous  Airway Management Planned: Oral ETT  Additional Equipment: None  Intra-op Plan:   Post-operative Plan: Extubation in OR  Informed Consent: I have reviewed the patients History and Physical, chart, labs and discussed the procedure including the risks, benefits and alternatives for the proposed anesthesia with the patient or authorized representative who has indicated his/her understanding and acceptance.   Dental advisory given  Plan Discussed with:   Anesthesia Plan Comments:         Anesthesia Quick Evaluation

## 2016-01-05 NOTE — Progress Notes (Signed)
PROGRESS NOTE    Alexis Buck  XBM:[id number] DOB: 1940/12/17 DOA: 01/01/2016 PCP: Lavera Guise, MD   Brief Narrative:  Alexis Chancy Mebaneis a 75 y.o.femalewith medical history significant for tobacco abuse, hypertension, and GERD who presented for evaluation of altered mental status. Patient had reportedly been complaining of difficulty focusing and vision changes going back approximately 6 weeks. Also over the past 6 weeks, her daughter is noted episodes in which the patient appears to "stare off." Patient was found  to be sitting on the floor after an apparent fall on day of admission. She was more confused than she had been, asking nonsensical questions, and was taken to the [facility] emergency department for evaluation.  In the ED chest x-ray featured a conspicuous mass in the left lung. CT of the head and chest were obtained, revealing vasogenic edema surrounding a brain mass in the right parieto-occipital region, suspected secondary to lung primary, and with interval increase in the ventricular system suggestive of developing hydrocephalus. Neurosurgery was consulted by the ED physician and advised a medical admission to [facility].  Patient was then transferred to [facility] for evaluation by neurosurgery.  MRI was attempted to assist in formulating treatment plan for Neurosurgery but was unable to be successfully completed secondary to patient's anxiety.  MRI under sedation to be done on 8/15.   Assessment & Plan:   Principal Problem:   Dehydration Active Problems:   Essential hypertension   Lung mass   Brain metastasis (HCC)   Altered mental status   Kidney disease   Hypokalemia   Leukocytosis   Hypercalcemia   Brain tumor (HCC)   Brain and lung masses  - Per radiology, large left lung mass appears to be primary, and brain mass a met - Neurosurgery is consulting and much appreciated - Decadron 10 mg IV given on admission for vasogenic brain edema; plan to  follow with 4 mg q6h  - MRI attempted twice but patient unable to stay still for an adequate study - MRI under anesthesia to be performed this morning - once MRI performed NSG will be able to discuss further treatment options with patient and family - daughter voices she believes lung mass could be just a breast cancer scare patient had 2 years ago  Altered mental status  - Secondary to brain mass, suspected to represent a metastasis from lung  - Awaiting consultant recommendations  - Alert and oriented to person, place, time (still difficult time orienting to situation) - case management and social work consults placed as daughter expresses she will need help with placement when time for d/c as patient is not safe to discharge home alone - family would like patient to either go to SNF or home with patient's son at time of discharge - MRI under sedation to be performed today  Kidney disease - SCr 1.83 on admission with no prior available for comparison  - Suspect there is an acute component given recent poor oral intake and s/s of dehydration  - Patient having good PO intake - will watch UOP - BMP today pending  Hypokalemia and Hypomagnesemia  - Serum potassium 2.7 on admission  - Likely secondary to HCTZ, will hold   - Magnesium level low; 2g IV given at start of admission  - Monitor on telemetry  - will order Mag tab daily - repeat potassium on 8/13 was 3.3  Hypercalcemia  - Serum calcium 11.7 on admission but decreased to 9.6 - Suspected secondary  to malignant process  - Continue rehydration with IVF  - Repeat Ca today pending  Leukocytosis  - WBC 24,500 on admission with neutrophilic predominance  - Suspect this is secondary to the malignant process rather than infection  - Blood and urine cultures are incubating (blood cx- no growth x 4 days; urine culture- no growth) - Watching off of abx for now  - patient afebrile, not consistently tachycardic (HR during exam was  60s-90s), not hypotensive  Hypertension - At goal currently - Managed with Coreg and HCTZ at home  - Hold HCTZ 2/2 hypokalemia - continue Coreg with monitoring  - blood pressures well controlled   DVT prophylaxis: heparin Code Status: full Family Communication: daughteris bedside, discussed plan with her  Disposition Plan: unclear at this time   Consultants:   Neurosurgery  Procedures:   none  Antimicrobials:   none    Subjective: Patient is awake this morning and seems much more oriented.  She knows it is 2017 and knows she is getting a MRI this morning.  She states she is hungry so she is anxious to get the MRI done.  Patient daughter states there was a family meeting yesterday after meeting with social worker and it was discovered that patient has been telling her primary care physician she is depressed and been put on antidepressant and that she has been increasing her alcohol consumption.  Patient's family feels as though there is a strong component of patient's closet alcoholism in her disorientation.  Patient's daughter mentions today that 2 years ago patient had a breast cancer scare which they believe could be the mass that was seen on CT scan at time of admission.   Objective: Vitals:   01/04/16 2333 01/05/16 0500 01/05/16 0509 01/05/16 0815  BP: (!) 155/104  135/72   Pulse: 72     Resp: 18     Temp: 98.6 F (37 C)  98 F (36.7 C) 98.4 F (36.9 C)  TempSrc: Oral  Oral Oral  SpO2: 93%   91%  Weight:  74.1 kg (163 lb 6.4 oz)    Height:        Intake/Output Summary (Last 24 hours) at 01/05/16 1039 Last data filed at 01/05/16 0511  Gross per 24 hour  Intake              480 ml  Output              750 ml  Net             -270 ml   Filed Weights   01/02/16 0429 01/03/16 0449 01/05/16 0500  Weight: 73.2 kg (161 lb 6 oz) 73.9 kg (162 lb 14.4 oz) 74.1 kg (163 lb 6.4 oz)    Examination:  General exam: Appears calm and comfortable  Respiratory system:  Clear to auscultation. Respiratory effort normal. Cardiovascular system: S1 & S2 heard, RRR. No JVD, murmurs, rubs, gallops or clicks. No pedal edema. Gastrointestinal system: Abdomen is nondistended, soft and nontender. No organomegaly or masses felt. Normal bowel sounds heard. Central nervous system: Alert and oriented to person and place.  Patient orientated to time (states it is 2017 but still states [name] is president) no insight into situation. CN II-XII were grossly intact.  No focal deficits noted Extremities: Symmetric 5 x 5 power in upper and lower extremities bilaterally Skin: No rashes, lesions or ulcers Psychiatry: Judgement at times appears normal. Mood & affect appropriate.     Data Reviewed: I have  personally reviewed following labs and imaging studies  CBC:  Recent Labs Lab 01/01/16 1047 01/02/16 0025 01/02/16 0805  WBC 24.5* 16.1* 16.7*  NEUTROABS 22.3*  --  15.8*  HGB 18.6* 14.0 14.3  HCT 55.2* 40.0 41.6  MCV 90.9 88.5 89.3  PLT 368 259 505   Basic Metabolic Panel:  Recent Labs Lab 01/01/16 1047 01/01/16 2206 01/02/16 0025 01/02/16 0805 01/03/16 1451  NA 140  --  135 137 133*  K 2.7*  --  3.0* 3.3* 3.3*  CL 93*  --  98* 100* 97*  CO2 31  --  27 26 27   GLUCOSE 181*  --  117* 95 103*  BUN 37*  --  31* 29* 29*  CREATININE 1.83*  --  1.28* 1.22* 1.04*  CALCIUM 11.7* 10.1 9.4 9.4 9.6  MG  --  1.6*  --   --   --    GFR: Estimated Creatinine Clearance: 40.4 mL/min (by C-G formula based on SCr of 1.04 mg/dL). Liver Function Tests:  Recent Labs Lab 01/01/16 1047 01/02/16 0025 01/02/16 0805  AST 34 21 26  ALT 22 16 18   ALKPHOS 69 46 56  BILITOT 1.2 1.0 0.7  PROT 7.0 5.0* 5.7*  ALBUMIN 3.2* 2.2* 2.4*   No results for input(s): LIPASE, AMYLASE in the last 168 hours. No results for input(s): AMMONIA in the last 168 hours. Coagulation Profile:  Recent Labs Lab 01/02/16 0025  INR 1.32   Cardiac Enzymes: No results for input(s): CKTOTAL, CKMB,  CKMBINDEX, TROPONINI in the last 168 hours. BNP (last 3 results) No results for input(s): PROBNP in the last 8760 hours. HbA1C: No results for input(s): HGBA1C in the last 72 hours. CBG:  Recent Labs Lab 01/04/16 1638 01/04/16 2054 01/04/16 2344 01/05/16 0503 01/05/16 0817  GLUCAP 146* 165* 143* 115* 114*   Lipid Profile: No results for input(s): CHOL, HDL, LDLCALC, TRIG, CHOLHDL, LDLDIRECT in the last 72 hours. Thyroid Function Tests: No results for input(s): TSH, T4TOTAL, FREET4, T3FREE, THYROIDAB in the last 72 hours. Anemia Panel: No results for input(s): VITAMINB12, FOLATE, FERRITIN, TIBC, IRON, RETICCTPCT in the last 72 hours. Sepsis Labs:  Recent Labs Lab 01/01/16 1047 01/01/16 1718 01/01/16 2210 01/02/16 0025  LATICACIDVEN 3.3* 2.1* 1.5 1.0    Recent Results (from the past 240 hour(s))  Blood Culture (routine x 2)     Status: None (Preliminary result)   Collection Time: 01/01/16 10:47 AM  Result Value Ref Range Status   Specimen Description BLOOD RIGHT ANTECUBITAL  Final   Special Requests BOTTLES DRAWN AEROBIC AND ANAEROBIC  [place]  Final   Culture NO GROWTH 4 DAYS  Final   Report Status PENDING  Incomplete  Blood Culture (routine x 2)     Status: None (Preliminary result)   Collection Time: 01/01/16 10:47 AM  Result Value Ref Range Status   Specimen Description BLOOD LEFT HAND  Final   Special Requests BOTTLES DRAWN AEROBIC AND ANAEROBIC  3CC  Final   Culture NO GROWTH 4 DAYS  Final   Report Status PENDING  Incomplete  Urine culture     Status: None   Collection Time: 01/01/16 10:47 AM  Result Value Ref Range Status   Specimen Description URINE, RANDOM  Final   Special Requests NONE  Final   Culture NO GROWTH Performed at [facility]   Final   Report Status 01/02/2016 FINAL  Final  MRSA PCR Screening     Status: None   Collection Time: 01/01/16  8:14 PM  Result Value Ref Range Status   MRSA by PCR NEGATIVE NEGATIVE Final    Comment:          The GeneXpert MRSA Assay (FDA approved for NASAL specimens only), is one component of a comprehensive MRSA colonization surveillance program. It is not intended to diagnose MRSA infection nor to guide or monitor treatment for MRSA infections.          Radiology Studies: No results found.      Scheduled Meds: . [MAR Hold] busPIRone  7.5 mg Oral BID  . [MAR Hold] carvedilol  12.5 mg Oral BID  . [MAR Hold] citalopram  20 mg Oral Daily  . [MAR Hold] dexamethasone  4 mg Intravenous Q6H  . [MAR Hold] heparin  5,000 Units Subcutaneous Q8H  . [MAR Hold] insulin aspart  0-9 Units Subcutaneous Q4H  . [MAR Hold] magnesium chloride  1 tablet Oral Daily  . [MAR Hold] pantoprazole  40 mg Oral Daily  . [MAR Hold] simvastatin  20 mg Oral QPM  . [MAR Hold] sodium chloride flush  3 mL Intravenous Q12H   Continuous Infusions: . lactated ringers 50 mL/hr at 01/05/16 0930     LOS: 4 days    Time spent: 30 minutes    Newman Pies, MD [facility] Pager [phone]  If 7PM-7AM, please contact night-coverage [email or web address] Password [facility] 01/05/2016, 10:39 AM

## 2016-01-05 NOTE — Anesthesia Postprocedure Evaluation (Signed)
Anesthesia Post Note  Patient: Alexis Buck  Procedure(s) Performed: Procedure(s) (LRB): MRI BRAIN WITH AND WITHOUT (N/A)  Patient location during evaluation: PACU Anesthesia Type: General Level of consciousness: awake and alert Pain management: pain level controlled Vital Signs Assessment: post-procedure vital signs reviewed and stable Respiratory status: spontaneous breathing, nonlabored ventilation, respiratory function stable and patient connected to nasal cannula oxygen Cardiovascular status: blood pressure returned to baseline and stable Postop Assessment: no signs of nausea or vomiting Anesthetic complications: no    Last Vitals:  Vitals:   01/05/16 1245 01/05/16 1251  BP: (!) 123/59   Pulse: 66 66  Resp: 19 18  Temp:  36.4 C    Last Pain:  Vitals:   01/05/16 1251  TempSrc:   PainSc: 0-No pain                 Reginal Lutes

## 2016-01-06 ENCOUNTER — Encounter (HOSPITAL_COMMUNITY): Payer: Self-pay | Admitting: Radiology

## 2016-01-06 DIAGNOSIS — E876 Hypokalemia: Secondary | ICD-10-CM

## 2016-01-06 DIAGNOSIS — R918 Other nonspecific abnormal finding of lung field: Secondary | ICD-10-CM

## 2016-01-06 DIAGNOSIS — N179 Acute kidney failure, unspecified: Secondary | ICD-10-CM

## 2016-01-06 DIAGNOSIS — C7949 Secondary malignant neoplasm of other parts of nervous system: Secondary | ICD-10-CM

## 2016-01-06 DIAGNOSIS — D496 Neoplasm of unspecified behavior of brain: Secondary | ICD-10-CM

## 2016-01-06 DIAGNOSIS — N183 Chronic kidney disease, stage 3 unspecified: Secondary | ICD-10-CM

## 2016-01-06 DIAGNOSIS — I10 Essential (primary) hypertension: Secondary | ICD-10-CM

## 2016-01-06 DIAGNOSIS — R222 Localized swelling, mass and lump, trunk: Secondary | ICD-10-CM

## 2016-01-06 DIAGNOSIS — C7931 Secondary malignant neoplasm of brain: Principal | ICD-10-CM

## 2016-01-06 LAB — CULTURE, BLOOD (ROUTINE X 2)
CULTURE: NO GROWTH
Culture: NO GROWTH

## 2016-01-06 LAB — GLUCOSE, CAPILLARY
GLUCOSE-CAPILLARY: 101 mg/dL — AB (ref 65–99)
GLUCOSE-CAPILLARY: 150 mg/dL — AB (ref 65–99)
GLUCOSE-CAPILLARY: 143 mg/dL — AB (ref 65–99)
Glucose-Capillary: 100 mg/dL — ABNORMAL HIGH (ref 65–99)
Glucose-Capillary: 151 mg/dL — ABNORMAL HIGH (ref 65–99)
Glucose-Capillary: 86 mg/dL (ref 65–99)

## 2016-01-06 NOTE — Consult Note (Signed)
Name: Alexis Buck MRN: [id number] DOB: 1941/04/04    ADMISSION DATE:  01/01/2016 CONSULTATION DATE:  01/06/2016  REFERRING MD :  Orson Eva, M.D. / [facility]  CHIEF COMPLAINT:  Left Lung Mass  SIGNIFICANT EVENTS  8/11 - Admit  STUDIES:  CT CHEST W/ 8/11: Personally reviewed by me. Bilateral calcified thyroid nodules with largest measuring 2.5 cm. Fluid level with distal narrowing and esophagus. Small bilateral pleural effusions. Mass within left lower lobe measuring 6.0 cm in maximal dimension. Mass appears to surround branches of pulmonary artery leading to left upper lobe. Narrowing of LLL bronchus. No pathologic mediastinal adenopathy. Mild emphysematous changes.  MRI BRAIN 8/15: 4 cm mass right occipital lobe with internal necrosis. Vasogenic edema affecting right hemisphere. 3 mm right to left shunt.  HISTORY OF PRESENT ILLNESS:  History obtained from the patient's electronic medical record as she is altered and unable to provide history. Patient presented to Hospital on 01/01/16 with approximately 6 weeks of vision changes as well as family noted periods of "staring off". Patient was found sitting on the floor after an apparent fall the date of admission. She was also noted to be more confused and had nonsensical speech. Patient was subsequently found to have an intracerebral mass that appeared necrotic as well as a left lower lobe mass on imaging. MRI was performed under sedation on 8/15. Patient subsequently started on Decadron for vasogenic edema around brain mass. At present the patient remains nonsensical with her speech and will not allow exam or further questioning.  PAST MEDICAL HISTORY:  Past Medical History:  Diagnosis Date  . Benign neoplasm of breast 2009  . Hypertension     PAST SURGICAL HISTORY: Past Surgical History:  Procedure Laterality Date  . BREAST BIOPSY Left 2009  . CESAREAN SECTION    . COLONOSCOPY  2006   Dr.KHAN  . POLYPECTOMY  2006  . RADIOLOGY WITH  ANESTHESIA N/A 01/05/2016   Procedure: MRI BRAIN WITH AND WITHOUT;  Surgeon: Medication Radiologist, MD;  Location: [facility];  Service: Radiology;  Laterality: N/A;    Prior to Admission medications   Medication Sig Start Date End Date Taking? Authorizing Provider  aspirin EC 81 MG tablet Take 81 mg by mouth daily.    Historical Provider, MD  busPIRone (BUSPAR) 7.5 MG tablet Take 7.5 mg by mouth 2 (two) times daily.    Historical Provider, MD  carvedilol (COREG) 12.5 MG tablet Take 12.5 mg by mouth 2 (two) times daily.    Historical Provider, MD  citalopram (CELEXA) 40 MG tablet Take 40 mg by mouth daily.    Historical Provider, MD  hydrochlorothiazide (HYDRODIURIL) 12.5 MG tablet Take 12.5 mg by mouth every morning.    Historical Provider, MD  Multiple Vitamins-Minerals (CENTRUM SILVER 50+WOMEN) TABS Take by mouth.    Historical Provider, MD  omeprazole (PRILOSEC) 20 MG capsule Take 20 mg by mouth daily.    Historical Provider, MD  simvastatin (ZOCOR) 20 MG tablet Take 20 mg by mouth every evening.    Historical Provider, MD   No Known Allergies  FAMILY HISTORY:  Family History  Problem Relation Age of Onset  . Brain cancer Neg Hx     SOCIAL HISTORY: Social History  Substance Use Topics  . Smoking status: Current Every Day Smoker    Packs/day: 1.00    Years: 30.00  . Smokeless tobacco: Not on file  . Alcohol use Yes    REVIEW OF SYSTEMS:  Unable to obtain secondary to altered  mentation.  SUBJECTIVE: As above.  VITAL SIGNS: Temp:  [97.2 F (36.2 C)-98.3 F (36.8 C)] 98.2 F (36.8 C) (08/16 0756) Pulse Rate:  [63-81] 63 (08/16 0756) Resp:  [13-25] 13 (08/16 0756) BP: (114-171)/(59-112) 171/80 (08/16 0756) SpO2:  [82 %-96 %] 96 % (08/16 0756) Weight:  [162 lb 14.4 oz (73.9 kg)] 162 lb 14.4 oz (73.9 kg) (08/16 0500)  PHYSICAL EXAMINATION: General:  Awake. Watching TV. No family at bedside.  Integument:  No rash on exposed skin. Not diaphoretic. Lymphatics:  No appreciated  cervical or supraclavicular lymphadenoapthy. HEENT:  No scleral injection or icterus.  Pulmonary:  No accessory muscle use. Speaking in complete sentences. Musculoskeletal:  No joint deformity or effusion appreciated. Neurological:  Symmetric face. Spontaneously moving upper extremities. Does not follow commands. Attends to voice bilaterally. Psychiatric:  Patient oriented to year and president. Believe she is in a federal building. He does not acknowledge that she has been hospitalized. Patient very anxious and distrustful of both myself and other staff members. Feels we are trying to hurt her.   Recent Labs Lab 01/02/16 0805 01/03/16 1451 01/05/16 1722  NA 137 133* 136  K 3.3* 3.3* 3.9  CL 100* 97* 98*  CO2 '26 27 29  '$ BUN 29* 29* 21*  CREATININE 1.22* 1.04* 0.91  GLUCOSE 95 103* 100*    Recent Labs Lab 01/02/16 0025 01/02/16 0805 01/05/16 1824  HGB 14.0 14.3 15.0  HCT 40.0 41.6 43.6  WBC 16.1* 16.7* 15.3*  PLT 259 274 317   Mr Brain W Wo Contrast  Result Date: 01/05/2016 CLINICAL DATA:  Acute presentation with altered mental status 4 days ago. Abnormal chest radiograph. Brain mass. EXAM: MRI HEAD WITHOUT AND WITH CONTRAST TECHNIQUE: Multiplanar, multiecho pulse sequences of the brain and surrounding structures were obtained without and with intravenous contrast. CONTRAST:  78m MULTIHANCE GADOBENATE DIMEGLUMINE 529 MG/ML IV SOLN COMPARISON:  CT 01/01/2016 FINDINGS: In the right occipital lobe, there is a 4 cm mass with internal necrosis measuring up to 4 cm in diameter. There is associated vasogenic edema within the right temporal, occipital and parietal regions. There is mass effect with flattening of the occipital horn of the right lateral ventricle. There is right-to-left shift of 3 mm. Enhancement extending anterior from the main tumor could possibly be a minimal enhancement in the collapsed occipital horn. No other a pen the mall enhancement is seen. No second mass lesion is  evident. The brain otherwise shows chronic small-vessel ischemic changes of the cerebral hemispheric white matter. No skull or skullbase lesion is seen. No hydrocephalus. No extra-axial fluid collection. No pituitary mass. No inflammatory sinus disease. IMPRESSION: 4 cm mass in the right occipital lobe with internal necrosis. Vasogenic edema affecting the right hemisphere. Right-to-left shift of 3 mm. In the setting of what looks like a primary lung cancer, metastatic disease is the most likely diagnosis. Lesion could represent a primary brain malignancy, but that is less likely. Electronically Signed   By: MNelson ChimesM.D.   On: 01/05/2016 12:58    ASSESSMENT / PLAN:  75year old female with known brain mass as well as lung mass. I attempted to examine the patient as well as interview her but she was unwilling to comply. I had a lengthy discussion with the patient's daughter via phone regarding our goals of care with the patient but was unable to reach the patient's son. I'm concerned that both of these lesions represent possible metastatic spread without pathologic mediastinal adenopathy. Certainly, this could still  represent a primary lung cancer. I briefly discuss performing biopsy and bronchoscopy on the patient but this would require endotracheal intubation and all the risks associated with the procedure as well as general anesthesia. The patient's daughter informs me that they experienced with lung cancer treatment regarding chemotherapy and radiation therapy as experienced through their father. At this time though she and her brother are discussing to what extent they wish to pursue invasive testing but she herself does not wish her mother to undergo invasive procedures. She previously has worked in an assisted living facility and would be in favor of more of a symptomatic control approach to treatment and would be amenable to palliative radiation. I have placed a consultation and call to the palliative  medicine service. I have attempted to contact Dr. Earley Favor the patient's attending hospitalist but have been unsuccessful. At this time PCCM will sign off. Please contact our service again if the family desires aggressive interventions.    Sonia Baller Ashok Cordia, M.D. [facility] Pager:  [phone] After 3pm or if no response, call [phone] 01/06/2016, 10:54 AM

## 2016-01-06 NOTE — Progress Notes (Signed)
I have reviewed the pts MRI which demonstrates a solitary peripherally enhancing right parietal lesion, almost certainly metastatic in nature. If this is non-small cell lung CA, I think the treatment of choice would be surgical resection with either preop or postop adjuvant radiation. Obviously if the pt and family do not wish to pursue aggressive treatment, SRS treatment alone might be an option. Certainly if she has small-cell lung CA then the brain lesion would be treated with radiation, without any real role for surgery.

## 2016-01-06 NOTE — Progress Notes (Addendum)
Chair alarm went off at 1315hrs and RN and NT rushed to the room to find Patient sitting on the floor with son in law Terrance attempting to pick her up.Per son in law, patient was trying to get out of the chair and the foot rest gave in and she slid off to the floor.Patient assessment done,MD text page. Camera operator and AD made aware.

## 2016-01-06 NOTE — Evaluation (Signed)
Speech Language Pathology Evaluation Patient Details Name: Alexis Buck MRN: [id number] DOB: 1941/04/24 Today's Date: 01/06/2016 Time: 7591-6384 SLP Time Calculation (min) (ACUTE ONLY): 26 min  Problem List:  Patient Active Problem List   Diagnosis Date Noted  . Acute renal failure superimposed on stage 3 chronic kidney disease ([place]) 01/06/2016  . Brain tumor ([place])   . Essential hypertension 01/01/2016  . Lung mass 01/01/2016  . Brain metastasis ([place]) 01/01/2016  . Dehydration 01/01/2016  . Altered mental status 01/01/2016  . Kidney disease 01/01/2016  . Hypokalemia 01/01/2016  . Leukocytosis 01/01/2016  . Hypercalcemia 01/01/2016   Past Medical History:  Past Medical History:  Diagnosis Date  . Benign neoplasm of breast 2009  . Hypertension    Past Surgical History:  Past Surgical History:  Procedure Laterality Date  . BREAST BIOPSY Left 2009  . CESAREAN SECTION    . COLONOSCOPY  2006   Dr.KHAN  . POLYPECTOMY  2006   HPI:  75 y.o. female with medical history significant for tobacco abuse, hypertension, and GERD who presented for evaluation of altered mental status. Patient had reportedly been complaining of difficulty focusing and vision changes going back approximately 6 weeks. Also over the past 6 weeks, her daughter is noted episodes in which the patient appears to "stare off." Patient was found  to be sitting on the floor after an apparent fall on day of admission. She was more confused than she had been, asking nonsensical questions, and was taken to the [facility] emergency department for evaluation.  In the ED chest x-ray featured a conspicuous mass in the left lung. CT of the head and chest were obtained, revealing vasogenic edema surrounding a brain mass in the right parieto-occipital region, suspected secondary to lung primary, and with interval increase in the ventricular system suggestive of developing hydrocephalus. Neurosurgery was consulted by the ED physician and  advised a medical admission to [facility].  Patient was then transferred to [facility].   Assessment / Plan / Recommendation Clinical Impression  Pt presents with significant cognitive-linguistic deficits secondary to mass in the right occipital lobe with edema affecting right hemisphere.  She presents with inattention, poor task completion, motor and verbal perseverations.  Visual perceptual deficits impact visual identification of objects.  Disoriented to elements of time, place, and situation (e.g., 1917, [place], [place] is president.)  Pt with limited awareness of deficits.  Language is intact, but inattention impacts ability to follow multistep commands or lengthy conversation.  Short-term memory impaired.  Agree with OT and PT recs that pt will benefit from CIR stay to address multiple deficits impacting safety and independence.    SLP Assessment  Patient needs continued Speech Lanaguage Pathology Services    Follow Up Recommendations  Inpatient Rehab    Frequency and Duration min 2x/week  2 weeks      SLP Evaluation Prior Functioning  Cognitive/Linguistic Baseline: Within functional limits Type of Home: Apartment Available Help at Discharge: Family;Available 24 hours/day   Cognition  Overall Cognitive Status: Impaired/Different from baseline Arousal/Alertness: Awake/alert Orientation Level: Oriented X4;Disoriented to place;Disoriented to time;Disoriented to situation Attention: Focused;Sustained Focused Attention: Impaired Focused Attention Impairment: Verbal basic;Functional basic Sustained Attention: Impaired Sustained Attention Impairment: Verbal basic;Functional basic Memory: Impaired Memory Impairment: Storage deficit;Retrieval deficit Awareness: Impaired Awareness Impairment: Intellectual impairment Problem Solving: Impaired Problem Solving Impairment: Verbal basic;Functional basic Executive Function: Reasoning;Self Monitoring Reasoning:  Impaired Reasoning Impairment: Verbal basic;Functional basic Self Monitoring: Impaired Behaviors: Restless;Impulsive;Perseveration Safety/Judgment: Impaired    Comprehension  Auditory Comprehension Overall Auditory Comprehension: Other (comment) (intermittent difficulty following commands due to inattentio) Visual Recognition/Discrimination Discrimination:  (perceptual deficits impact function) Reading Comprehension Reading Status: Impaired    Expression Expression Primary Mode of Expression: Verbal Verbal Expression Overall Verbal Expression: Appears within functional limits for tasks assessed   Oral / Motor  Oral Motor/Sensory Function Overall Oral Motor/Sensory Function: Within functional limits Motor Speech Overall Motor Speech: Appears within functional limits for tasks assessed   GO                   Navayah Sok L. Tivis Ringer, [place] CCC/SLP Pager [phone]  Juan Quam Laurice 01/06/2016, 10:24 AM

## 2016-01-06 NOTE — Progress Notes (Signed)
Physical Therapy Treatment Patient Details Name: Alexis Buck MRN: [id number] DOB: [birth year] Today's Date: 01/06/2016    History of Present Illness 75 y.o. female with medical history significant for tobacco abuse, hypertension, and GERD who presented for evaluation of altered mental status. Patient had reportedly been complaining of difficulty focusing and vision changes going back approximately 6 weeks. Also over the past 6 weeks, her daughter is noted episodes in which the patient appears to "stare off." Patient was found to be sitting on the floor after an apparent fall on day of admission. She was more confused than she had been, asking nonsensical questions, and was taken to the [facility] emergency department for evaluation. In the ED chest x-ray featured a conspicuous mass in the left lung. CT of the head and chest were obtained, revealing vasogenic edema surrounding a brain mass in the right parieto-occipital region, suspected secondary to lung primary, and with interval increase in the ventricular system suggestive of developing hydrocephalus. Neurosurgery was consulted by the ED physician and advised a medical admission to [facility]. Patient was then transferred to [place] requires HHA at times while ambulating to direct pt in hallway.  She was much more anxious today, likely related to family not present and pt anxious about where her daughter is.  Pt fatigues quickly with ambulatory activity and sit<>stand exercise x3.  Pt will benefit from continued skilled PT services to increase functional independence and safety.   Follow Up Recommendations  CIR     Equipment Recommendations  Rolling walker with 5" wheels    Recommendations for Other Services       Precautions / Restrictions Precautions Precautions: Fall Restrictions Weight Bearing Restrictions: No    Mobility  Bed Mobility               General bed mobility  comments: Pt sitting in recliner chair upon PT arrival  Transfers Overall transfer level: Needs assistance Equipment used: None Transfers: Sit to/from Stand Sit to Stand: Min guard         General transfer comment: Close min guard for safety  Ambulation/Gait Ambulation/Gait assistance: Min assist Ambulation Distance (Feet): 115 Feet Assistive device: None Gait Pattern/deviations: Step-through pattern;Decreased stride length Gait velocity: decreased   General Gait Details: Pt continues to require multimodal directional cues and unable to lead therapist back to her room.  HHA to direct pt in hallway at times.     Stairs            Wheelchair Mobility    Modified Rankin (Stroke Patients Only) Modified Rankin (Stroke Patients Only) Pre-Morbid Rankin Score: No significant disability Modified Rankin: Moderately severe disability     Balance Overall balance assessment: Needs assistance Sitting-balance support: No upper extremity supported;Feet supported Sitting balance-Leahy Scale: Good     Standing balance support: No upper extremity supported;During functional activity Standing balance-Leahy Scale: Fair                      Cognition Arousal/Alertness: Awake/alert Behavior During Therapy: WFL for tasks assessed/performed Overall Cognitive Status: Impaired/Different from baseline Area of Impairment: Orientation;Attention;Memory;Safety/judgement;Following commands;Awareness;Problem solving Orientation Level: Disoriented to;Place;Time;Situation Current Attention Level: Sustained Memory: Decreased short-term memory Following Commands: Follows one step commands inconsistently Safety/Judgement: Decreased awareness of safety;Decreased awareness of deficits Awareness: Intellectual Problem Solving: Slow processing;Decreased initiation;Difficulty sequencing;Requires verbal cues;Requires tactile cues General Comments: Pt unable to remember room number after just  being told.  Requires  directional cues and cues to scan visual field for obstacles.    Exercises Other Exercises Other Exercises: Sit<>stand from chair x3 but pt fatigues quickly and declines attempting further.    General Comments        Pertinent Vitals/Pain Pain Assessment: No/denies pain    Home Living                      Prior Function            PT Goals (current goals can now be found in the care plan section) Acute Rehab PT Goals Patient Stated Goal: Pt did not state PT Goal Formulation: With patient/family Time For Goal Achievement: 01/16/16 Potential to Achieve Goals: Good Progress towards PT goals: Progressing toward goals    Frequency  Min 3X/week    PT Plan Current plan remains appropriate    Co-evaluation             End of Session Equipment Utilized During Treatment: Gait belt Activity Tolerance: Patient tolerated treatment well;Patient limited by fatigue Patient left: in chair;with call bell/phone within reach;with chair alarm set     Time: 1150-1205 PT Time Calculation (min) (ACUTE ONLY): 15 min  Charges:  $Gait Training: 8-22 mins                    G Codes:      Collie Siad PT, DPT  Pager: [phone] Phone: [phone] 01/06/2016, 4:11 PM

## 2016-01-06 NOTE — Care Management Important Message (Signed)
Important Message  Patient Details  Name: Alexis Buck MRN: [id number] Date of Birth: [birth year]   Medicare Important Message Given:  Yes    Loann Quill 01/06/2016, 11:38 AM

## 2016-01-06 NOTE — Progress Notes (Signed)
PROGRESS NOTE  KINDRED HEYING ZYY:[id number] DOB: June 03, 1940 DOA: 01/01/2016 PCP: Lavera Guise, MD  Brief History:  75 y.o.femalewith medical history significant for tobacco abuse, hypertension, and GERD who presentedfor evaluation of altered mental status. Patient had reportedly been complaining of difficulty focusing and vision changes going back approximately 6 weeks. Also over the past 6 weeks, her daughter is noted episodes in which the patient appears to "stare off." Patient was found to be sitting on the floor after an apparent fall on day of admission. She was more confused than she had been, asking nonsensical questions, and was taken to the [facility] emergency department for evaluation. In the ED chest x-ray featured a conspicuous mass in the left lung. CT of the head and chest were obtained, revealing vasogenic edema surrounding a brain mass in the right parieto-occipital region, suspected secondary to lung primary, and with interval increase in the ventricular system suggestive of developing hydrocephalus. Neurosurgery was consulted by the ED physician and advised a medical admission to [facility]. Patient was then transferred to [facility] for evaluation by neurosurgery.  MRI was attempted to assist in formulating treatment plan for Neurosurgery but was unable to be successfully completed secondary to patient's anxiety.  MRI under sedation to be done on 8/15.  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) - SCr 1.83 on admission with no prior available for comparison  - Suspect there is an acute component given recent poor oral intake and s/s of dehydration  - Patient having good PO intake -serum creatinine improved -suspect baseline creatinine 0.9-1.2  Hypokalemia and Hypomagnesemia  - Serum potassium 2.7 on admission  - Likely secondary to HCTZ, will hold   - Magnesium level low; 2g IV given at start of admission  - repleted  Hypercalcemia  - Serum calcium 11.7 on admission but decreased to 9.6 - Suspected secondary to malignant process  - Continue rehydration with IVF   Leukocytosis  - WBC 24,500 on admission with neutrophilic predominance  - Suspect this is secondary to the malignant process rather than infection  - Blood and urine cultures are incubating (blood cx- no growth x 4 days; urine culture- no growth) - Watching off of abx for now   Hypertension - Managed with Coreg and HCTZ at home  - Hold HCTZ 2/2 hypokalemia - continue Coreg with monitoring  - blood  pressures well controlled   Disposition Plan:   Not stable for d/c Family Communication:   Daughter updated at bedside  Consultants:  Pulmonary, neurosurgery  Code Status:  FULL   DVT Prophylaxis:  [place] Heparin    Procedures: As Listed in Progress Note  Above  Antibiotics: None    Subjective: Patient denies fevers, chills, headache, chest pain, dyspnea, nausea, vomiting, diarrhea, abdominal pain, dysuria, hematuria, hematochezia, and melena.   Objective: Vitals:   01/05/16 2305 01/06/16 0307 01/06/16 0500 01/06/16 0756  BP: 138/67 (!) 150/79  (!) 171/80  Pulse: 76 63  63  Resp: 17 (!) 22  13  Temp: 98.3 F (36.8 C) 97.6 F (36.4 C)  98.2 F (36.8 C)  TempSrc: Oral Oral  Oral  SpO2: (!) 82% 92%  96%  Weight:   73.9 kg (162 lb 14.4 oz)   Height:        Intake/Output Summary (Last 24 hours) at 01/06/16 0912 Last data filed at 01/06/16 0700  Gross per 24 hour  Intake              853 ml  Output              725 ml  Net              128 ml   Weight change: -0.227 kg (-8 oz) Exam:   General:  Pt is alert, follows commands appropriately, not in acute distress  HEENT: No icterus, No thrush, No neck mass, [place]/AT  Cardiovascular: RRR, S1/S2, no rubs, no gallops  Respiratory: Bibasilar crackles without any wheezing. Good air movement.  Abdomen: Soft/+BS, non tender, non distended, no guarding  Extremities: No edema, No lymphangitis, No petechiae, No rashes, no synovitis   Data Reviewed: I have personally reviewed following labs and imaging studies Basic Metabolic Panel:  Recent Labs Lab 01/01/16 1047 01/01/16 2206 01/02/16 0025 01/02/16 0805 01/03/16 1451 01/05/16 1722  NA 140  --  135 137 133* 136  K 2.7*  --  3.0* 3.3* 3.3* 3.9  CL 93*  --  98* 100* 97* 98*  CO2 31  --  '27 26 27 29  '$ GLUCOSE 181*  --  117* 95 103* 100*  BUN 37*  --  31* 29* 29* 21*  CREATININE 1.83*  --  1.28* 1.22* 1.04* 0.91  CALCIUM 11.7* 10.1 9.4 9.4 9.6 9.1  MG  --  1.6*  --   --   --   --    Liver Function Tests:  Recent Labs Lab 01/01/16 1047 01/02/16 0025 01/02/16 0805 01/05/16 1722  AST 34 '21 26 27  '$ ALT '22 16 18 30  '$ ALKPHOS 69 46 56 48  BILITOT 1.2 1.0 0.7 0.6  PROT 7.0 5.0* 5.7* 5.5*  ALBUMIN 3.2* 2.2* 2.4* 2.4*    No results for input(s): LIPASE, AMYLASE in the last 168 hours. No results for input(s): AMMONIA in the last 168 hours. Coagulation Profile:  Recent Labs Lab 01/02/16 0025  INR 1.32   CBC:  Recent Labs Lab 01/01/16 1047 01/02/16 0025 01/02/16 0805 01/05/16 1824  WBC 24.5* 16.1* 16.7* 15.3*  NEUTROABS 22.3*  --  15.8* 14.4*  HGB 18.6* 14.0 14.3 15.0  HCT 55.2* 40.0 41.6 43.6  MCV 90.9 88.5 89.3 90.3  PLT 368 259 274 317   Cardiac Enzymes: No results for input(s): CKTOTAL, CKMB, CKMBINDEX, TROPONINI in the last 168 hours. BNP: Invalid input(s): POCBNP CBG:  Recent Labs Lab 01/05/16 1619 01/05/16 1949 01/05/16 2308  01/06/16 0513 01/06/16 0754  GLUCAP 100* 180* 149* 101* 100*   HbA1C: No results for input(s): HGBA1C in the last 72 hours. Urine analysis:    Component Value Date/Time   COLORURINE AMBER (A) 01/01/2016 1047   APPEARANCEUR HAZY (A) 01/01/2016 1047   LABSPEC 1.029 01/01/2016 1047   PHURINE 5.0 01/01/2016 1047   GLUCOSEU NEGATIVE 01/01/2016 1047   HGBUR NEGATIVE 01/01/2016 1047   BILIRUBINUR NEGATIVE 01/01/2016 1047   KETONESUR NEGATIVE 01/01/2016 1047   PROTEINUR 100 (A) 01/01/2016 1047   NITRITE NEGATIVE 01/01/2016 1047   LEUKOCYTESUR NEGATIVE 01/01/2016 1047   Sepsis Labs: '@LABRCNTIP'$ (procalcitonin:4,lacticidven:4) ) Recent Results (from the past 240 hour(s))  Blood Culture (routine x 2)     Status: None (Preliminary result)   Collection Time: 01/01/16 10:47 AM  Result Value Ref Range Status   Specimen Description BLOOD RIGHT ANTECUBITAL  Final   Special Requests BOTTLES DRAWN AEROBIC AND ANAEROBIC  [place]  Final   Culture NO GROWTH 4 DAYS  Final   Report Status PENDING  Incomplete  Blood Culture (routine x 2)     Status: None (Preliminary result)   Collection Time: 01/01/16 10:47 AM  Result Value Ref Range Status   Specimen Description BLOOD LEFT HAND  Final   Special Requests BOTTLES DRAWN AEROBIC AND ANAEROBIC  3CC  Final   Culture  NO GROWTH 4 DAYS  Final   Report Status PENDING  Incomplete  Urine culture     Status: None   Collection Time: 01/01/16 10:47 AM  Result Value Ref Range Status   Specimen Description URINE, RANDOM  Final   Special Requests NONE  Final   Culture NO GROWTH Performed at [facility]   Final   Report Status 01/02/2016 FINAL  Final  MRSA PCR Screening     Status: None   Collection Time: 01/01/16  8:14 PM  Result Value Ref Range Status   MRSA by PCR NEGATIVE NEGATIVE Final    Comment:        The GeneXpert MRSA Assay (FDA approved for NASAL specimens only), is one component of a comprehensive MRSA colonization surveillance program. It is not intended to diagnose MRSA infection nor to guide or monitor treatment for MRSA infections.      Scheduled Meds: . busPIRone  7.5 mg Oral BID  . carvedilol  12.5 mg Oral BID  . citalopram  20 mg Oral Daily  . dexamethasone  4 mg Intravenous Q6H  . heparin  5,000 Units Subcutaneous Q8H  . insulin aspart  0-9 Units Subcutaneous Q4H  . magnesium chloride  1 tablet Oral Daily  . pantoprazole  40 mg Oral Daily  . simvastatin  20 mg Oral QPM  . sodium chloride flush  3 mL Intravenous Q12H   Continuous Infusions: . lactated ringers 50 mL/hr at 01/05/16 1535    Procedures/Studies: Ct Head Wo Contrast  Result Date: 01/01/2016 CLINICAL DATA:  75 year old female with a history of altered mental status EXAM: CT HEAD WITHOUT CONTRAST TECHNIQUE: Contiguous axial images were obtained from the base of the skull through the vertex without intravenous contrast. COMPARISON:  08/17/2012, CT of the same date FINDINGS: Extensive confluent hypodensity in the right subcortical white matter extending to the ventricular margin. Gray-white differentiation maintained. There is a heterogeneously dense mass in the right parieto-occipital region, extending to the inner table of the calvarium, measuring 3.4 cm by 2.9 cm. Local mass effect, with distortion of the  posterior horn of the right lateral ventricle. The ventricles  are enlarged compared to the prior CT. No intracranial hemorrhage. Expanded appearance of the posterior right corpus callosum, which is hypodense. Unremarkable appearance of the calvarium without acute fracture or aggressive lesion. Unremarkable appearance of the scalp soft tissues. Unremarkable appearance of the bilateral orbits. Mastoid air cells are clear. No significant paranasal sinus disease. IMPRESSION: Evidence of vasogenic edema of the right cerebral white matter secondary to mass (3.5 cm x 2.9 cm) in the right parieto-occipital region. Given the appearance of the chest CT performed on today's date, this most likely represents metastatic disease. Further evaluation with contrast-enhanced MR is indicated. Expanded appearance of the posterior right corpus callosum, potentially edema and/or tumor. Increasing size of the ventricular system compare to the prior CT, potentially representing developing hydrocephalus. These results were called by telephone at the time of interpretation on 01/01/2016 at 12:39 pm to Dr. Lavonia Drafts , who verbally acknowledged these results. Signed, Dulcy Fanny. Earleen Newport, DO [facility] Radiology Electronically Signed   By: Corrie Mckusick D.O.   On: 01/01/2016 12:40   Ct Chest W Contrast  Result Date: 01/01/2016 CLINICAL DATA:  Recent altered mental status with mass on recent chest x-ray EXAM: CT CHEST WITH CONTRAST TECHNIQUE: Multidetector CT imaging of the chest was performed during intravenous contrast administration. CONTRAST:  2m ISOVUE-300 IOPAMIDOL (ISOVUE-300) INJECTION 61% COMPARISON:  Plain film from earlier in the same day FINDINGS: Cardiovascular: Thoracic aorta demonstrates some calcific changes without evidence of aneurysm or dissection. The pulmonary artery as visualized is within normal limits. Coronary calcifications are noted. The cardiac structures are  otherwise within normal limits. Mediastinum/Nodes: The thoracic inlet demonstrates bilateral calcified thyroid nodules. The largest of these lies on the right measuring approximately 2.5 cm in greatest dimension. These are stable from a prior exam from 2014. Fluid level is noted within the esophagus which may be related to some distal narrowing. No significant hilar or mediastinal adenopathy is noted. Lungs/Pleura: Mild dependent atelectasis is noted in the lower lobes. Small bilateral pleural effusions are seen. In the left lower lobe abutting the major fissure, there is a 6.0 by 3.7 by 5.5 cm soft tissue mass lesion. On the sagittal reconstructions, the lesion appears to extend across the minor fissure into the upper lobe. It envelops branches of the left lower lobe pulmonary artery. There is also impingement upon the lower lobe bronchial tree. These changes are consistent with primary pulmonary neoplasm. Upper Abdomen: Within normal limits. Musculoskeletal: Degenerative changes of the thoracic spine are noted. 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 Electronically Signed   By: MInez CatalinaM.D.   On: 01/01/2016 12:39   Mr BJeri CosWPTContrast  Result Date: 01/05/2016 CLINICAL DATA:  Acute presentation with altered mental status 4 days ago. Abnormal chest radiograph. Brain mass. EXAM: MRI HEAD WITHOUT AND WITH CONTRAST TECHNIQUE: Multiplanar, multiecho pulse sequences of the brain and surrounding structures were obtained without and with intravenous contrast. CONTRAST:  12mMULTIHANCE GADOBENATE DIMEGLUMINE 529 MG/ML IV SOLN COMPARISON:  CT 01/01/2016 FINDINGS: In the right occipital lobe, there is a 4 cm mass with internal necrosis measuring up to 4 cm in diameter. There is associated vasogenic edema within the right temporal, occipital and parietal regions. There is mass effect with flattening of the occipital horn of the right lateral ventricle. There is  right-to-left shift of 3 mm. Enhancement extending anterior from the main tumor could possibly be a minimal enhancement in the collapsed occipital horn. No  other a pen the mall enhancement is seen. No second mass lesion is evident. The brain otherwise shows chronic small-vessel ischemic changes of the cerebral hemispheric white matter. No skull or skullbase lesion is seen. No hydrocephalus. No extra-axial fluid collection. No pituitary mass. No inflammatory sinus disease. IMPRESSION: 4 cm mass in the right occipital lobe with internal necrosis. Vasogenic edema affecting the right hemisphere. Right-to-left shift of 3 mm. In the setting of what looks like a primary lung cancer, metastatic disease is the most likely diagnosis. Lesion could represent a primary brain malignancy, but that is less likely. Electronically Signed   By: Nelson Chimes M.D.   On: 01/05/2016 12:58   Dg Chest Port 1 View  Result Date: 01/01/2016 CLINICAL DATA:  Altered mental status, recent altered mental status EXAM: PORTABLE CHEST 1 VIEW COMPARISON:  02/10/2005 FINDINGS: Cardiac shadow is mildly enlarged. There is 7 cm mass lesions superimposed over the left hilar region new from the prior exam. No bony abnormality is seen. IMPRESSION: Changes consistent with a large left lung mass. CT of the chest is recommended for further evaluation. Electronically Signed   By: Inez Catalina M.D.   On: 01/01/2016 11:08    Lia Vigilante, DO  [facility] Pager [phone]  If 7PM-7AM, please contact night-coverage [email or web address] Password [email or web address] 01/06/2016, 9:12 AM   LOS: 5 days

## 2016-01-06 NOTE — Plan of Care (Signed)
Problem: Safety: Goal: Ability to remain free from injury will improve Outcome: Not Progressing Pt is confused and impulsive.  She removed 2 IV lines overnight and had to be redirected back to bed on numerous occasions.

## 2016-01-07 ENCOUNTER — Encounter (HOSPITAL_COMMUNITY): Payer: Self-pay

## 2016-01-07 LAB — BASIC METABOLIC PANEL
Anion gap: 9 (ref 5–15)
BUN: 17 mg/dL (ref 6–20)
CHLORIDE: 99 mmol/L — AB (ref 101–111)
CO2: 28 mmol/L (ref 22–32)
CREATININE: 0.86 mg/dL (ref 0.44–1.00)
Calcium: 8.4 mg/dL — ABNORMAL LOW (ref 8.9–10.3)
GFR calc non Af Amer: >60 mL/min (ref >60)
GLUCOSE: 106 mg/dL — AB (ref 65–99)
Potassium: 3.5 mmol/L (ref 3.5–5.1)
Sodium: 136 mmol/L (ref 135–145)

## 2016-01-07 LAB — GLUCOSE, CAPILLARY
GLUCOSE-CAPILLARY: 106 mg/dL — AB (ref 65–99)
GLUCOSE-CAPILLARY: 132 mg/dL — AB (ref 65–99)
Glucose-Capillary: 101 mg/dL — ABNORMAL HIGH (ref 65–99)
Glucose-Capillary: 154 mg/dL — ABNORMAL HIGH (ref 65–99)
Glucose-Capillary: 153 mg/dL — ABNORMAL HIGH (ref 65–99)

## 2016-01-07 MED ORDER — QUETIAPINE FUMARATE 25 MG PO TABS
25.0000 mg | ORAL_TABLET | Freq: Every day | ORAL | Status: DC
  Administered 2016-01-07 – 2016-01-08 (×2): 25 mg via ORAL
  Filled 2016-01-07 (×2): qty 1

## 2016-01-07 NOTE — Progress Notes (Signed)
CSW met with pt and pt dtr to provide offers- no choice at this time  CSW will continue to follow   H. , LCSWA Clinical Social Worker [phone]  

## 2016-01-07 NOTE — Progress Notes (Signed)
I was able to speek with the patient's daughter this morning to outline the findings thus far  regarding her brain tumor and lung tumor. I explained to the patient's daughter, that although we do not have tissue confirmation of this,the clinical picture would support a stage IV lung cancer. The patient's daughter states that they just went through a lung cancer diagnosis that ended with  the family member passing, and are not interested in seeking aggressive means of workup for treatment. She stated that she did not think that her mother would be able to undergo any type of aggressive therapy or intervention. They are interested however in palliate her symptoms and moving forward with quality of life being the priority in her care that she does receive. I explained to her typical pathway of what an aggressive workup would look like including tissue confirmation, chemotherapy and radiotherapy and given her lesion in the brain, stereotactic radiosurgery followed by surgical excision. Again after describing these interventions, the daughter states that they are not interested in proceeding with this type of treatment.  Rather they would be interested in considering thoracentesis therapeutically, and if diagnostic features from this could detail her diagnosis that would be great. They also understand that if cytology was negative that does not exclude the diagnosis of lung cancer. Given the patient and daughter's wishes, we discussed palliative care consult to discuss goals of care and CODE STATUS. We appreciate the opportunity to peripherally participate with the patient, if formal consultation is desired please do not hesitate to call us.     Carola Rhine, PAC

## 2016-01-07 NOTE — Progress Notes (Signed)
Palliative Medicine Team consult was received.   I stopped by to meet with Alexis Buck this afternoon. She reports that her daughter needs to be part of any conversations moving forward.  I called and spoke with her daughter, Alexis Buck.  We have set up a meeting for tomorrow at 7:00AM.   If there are urgent needs or questions please call [phone]. Thank you for consulting out team to assist with this patients care.  Micheline Rough, MD [place] Team [phone]

## 2016-01-07 NOTE — Progress Notes (Signed)
Attempted report 

## 2016-01-07 NOTE — Progress Notes (Signed)
PROGRESS NOTE  Alexis Buck FVC:[id number] DOB: 07-08-1940 DOA: 01/01/2016 PCP: Lavera Guise, MD  Brief History:  75 y.o.femalewith medical history significant for tobacco abuse, hypertension, and GERD who presentedfor evaluation of altered mental status. Patient had reportedly been complaining of difficulty focusing and vision changes going back approximately 6 weeks. Also over the past 6 weeks, her daughter is noted episodes in which the patient appears to "stare off." Patient was found to be sitting on the floor after an apparent fall on day of admission. She was more confused than she had been, asking nonsensical questions, and was taken to the [facility] emergency department for evaluation. In the ED chest x-ray featured a conspicuous mass in the left lung. CT of the head and chest were obtained, revealing vasogenic edema surrounding a brain mass in the right parieto-occipital region, suspected secondary to lung primary, and with interval increase in the ventricular system suggestive of developing hydrocephalus. Neurosurgery was consulted by the ED physician and advised a medical admission to [facility]. Patient was then transferred to [facility] for evaluation by neurosurgery. MRI was attempted to assist in formulating treatment plan for Neurosurgery but was unable to be successfully completed secondary to patient's anxiety. MRI under sedation to be done on 8/15.  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) - SCr 1.83 on admission with no prior available for comparison  - Suspect there is an acute component given recent poor oral intake and s/s of dehydration  - Patient having good PO intake -serum creatinine improved -suspect baseline creatinine 0.9-1.2  Hypokalemia and Hypomagnesemia  - Serum potassium 2.7 on admission  - Likely secondary to HCTZ, will hold  - Magnesium level low; 2g IV given at start of admission  - repleted  Hypercalcemia  - Serum calcium 11.7 on admission but decreased to 9.6 - Suspected secondary to malignant process  - Continue rehydration with IVF   Leukocytosis  - WBC 24,500 on admission with neutrophilic predominance  - Suspect this is secondary to the malignant process rather than infection  - Blood and urine cultures--neg - Watching off of abx  for now   Hypertension - Managed with Coreg and HCTZ at home  - Hold HCTZ 2/2 hypokalemia - continue Coreg with monitoring  - blood pressures reasonably  controlled   Disposition Plan:   Home with hospice vs CIR Family Communication:   Daughter updated at bedside--Total time spent 35 minutes.  Greater than 50% spent face to face counseling and coordinating care.   Consultants:  Pulmonary, neurosurgery  Code Status:  FULL   DVT Prophylaxis:  [place] Heparin    Procedures: As Listed in Progress Note Above  Antibiotics: None    Subjective: Patient denies fevers, chills, headache, chest pain, dyspnea, nausea, vomiting, diarrhea, abdominal pain, dysuria, hematuria, hematochezia, and melena.   Objective: Vitals:   01/06/16 2348 01/07/16 0327 01/07/16 0802 01/07/16 1200  BP: (!) 141/87 136/73 135/83 (!) 141/75  Pulse: 68 74 72 66  Resp:  '16 18 19  '$ Temp: 98.5 F (36.9 C) 98.2 F (36.8 C) 97.8 F (36.6 C) 98.2 F (36.8 C)  TempSrc: Oral Oral Oral Oral  SpO2: 94% 92% 93% 97%  Weight:  72.4 kg (159 lb 9.8 oz)    Height:        Intake/Output Summary (Last 24 hours) at 01/07/16 1355 Last data filed at 01/07/16 0929  Gross per 24 hour  Intake            747.5 ml  Output             1800 ml  Net          -1052.5 ml   Weight change: -1.491 kg (-3 lb 4.6 oz) Exam:   General:  Pt is alert, follows commands appropriately, not in acute distress  HEENT: No icterus, No thrush, No neck mass, [place]/AT  Cardiovascular: RRR, S1/S2, no rubs, no gallops  Respiratory: Bibasilar rales. No wheezing. Good air movement.  Abdomen: Soft/+BS, non tender, non distended, no guarding  Extremities: No edema, No lymphangitis, No petechiae, No rashes, no synovitis   Data Reviewed: I have personally reviewed following labs and imaging studies Basic Metabolic Panel:  Recent Labs Lab 01/01/16 2206 01/02/16 0025 01/02/16 0805 01/03/16 1451 01/05/16 1722 01/07/16 0319  NA  --  135 137 133* 136 136  K  --  3.0* 3.3* 3.3* 3.9 3.5  CL  --  98* 100* 97* 98* 99*  CO2  --  '27 26 27 29 28  '$ GLUCOSE  --  117* 95 103* 100* 106*  BUN  --   31* 29* 29* 21* 17  CREATININE  --  1.28* 1.22* 1.04* 0.91 0.86  CALCIUM 10.1 9.4 9.4 9.6 9.1 8.4*  MG 1.6*  --   --   --   --   --    Liver Function Tests:  Recent Labs Lab 01/01/16 1047 01/02/16 0025 01/02/16 0805 01/05/16 1722  AST 34 '21 26 27  '$ ALT '22 16 18 30  '$ ALKPHOS 69 46 56 48  BILITOT 1.2 1.0 0.7 0.6  PROT 7.0 5.0* 5.7* 5.5*  ALBUMIN 3.2* 2.2* 2.4* 2.4*   No results for input(s): LIPASE, AMYLASE in the last 168 hours. No results for input(s): AMMONIA in the last 168 hours. Coagulation Profile:  Recent Labs Lab 01/02/16 0025  INR 1.32   CBC:  Recent Labs Lab 01/01/16 1047 01/02/16 0025 01/02/16 0805 01/05/16 1824  WBC 24.5* 16.1* 16.7* 15.3*  NEUTROABS 22.3*  --  15.8* 14.4*  HGB 18.6* 14.0 14.3 15.0  HCT 55.2* 40.0 41.6 43.6  MCV 90.9 88.5  89.3 90.3  PLT 368 259 274 317   Cardiac Enzymes: No results for input(s): CKTOTAL, CKMB, CKMBINDEX, TROPONINI in the last 168 hours. BNP: Invalid input(s): POCBNP CBG:  Recent Labs Lab 01/06/16 2058 01/06/16 2357 01/07/16 0451 01/07/16 0801 01/07/16 1157  GLUCAP 143* 86 106* 101* 154*   HbA1C: No results for input(s): HGBA1C in the last 72 hours. Urine analysis:    Component Value Date/Time   COLORURINE AMBER (A) 01/01/2016 1047   APPEARANCEUR HAZY (A) 01/01/2016 1047   LABSPEC 1.029 01/01/2016 1047   PHURINE 5.0 01/01/2016 1047   GLUCOSEU NEGATIVE 01/01/2016 1047   HGBUR NEGATIVE 01/01/2016 1047   BILIRUBINUR NEGATIVE 01/01/2016 1047   KETONESUR NEGATIVE 01/01/2016 1047   PROTEINUR 100 (A) 01/01/2016 1047   NITRITE NEGATIVE 01/01/2016 1047   LEUKOCYTESUR NEGATIVE 01/01/2016 1047   Sepsis Labs: '@LABRCNTIP'$ (procalcitonin:4,lacticidven:4) ) Recent Results (from the past 240 hour(s))  Blood Culture (routine x 2)     Status: None   Collection Time: 01/01/16 10:47 AM  Result Value Ref Range Status   Specimen Description BLOOD RIGHT ANTECUBITAL  Final   Special Requests BOTTLES DRAWN AEROBIC AND  ANAEROBIC  [place]  Final   Culture NO GROWTH 5 DAYS  Final   Report Status 01/06/2016 FINAL  Final  Blood Culture (routine x 2)     Status: None   Collection Time: 01/01/16 10:47 AM  Result Value Ref Range Status   Specimen Description BLOOD LEFT HAND  Final   Special Requests BOTTLES DRAWN AEROBIC AND ANAEROBIC  3CC  Final   Culture NO GROWTH 5 DAYS  Final   Report Status 01/06/2016 FINAL  Final  Urine culture     Status: None   Collection Time: 01/01/16 10:47 AM  Result Value Ref Range Status   Specimen Description URINE, RANDOM  Final   Special Requests NONE  Final   Culture NO GROWTH Performed at [facility]   Final   Report Status 01/02/2016 FINAL  Final  MRSA PCR Screening     Status: None   Collection Time: 01/01/16  8:14 PM  Result Value Ref Range Status   MRSA by PCR NEGATIVE NEGATIVE Final    Comment:        The GeneXpert MRSA Assay (FDA approved for NASAL specimens only), is one component of a comprehensive MRSA colonization surveillance program. It is not intended to diagnose MRSA infection nor to guide or monitor treatment for MRSA infections.      Scheduled Meds: . busPIRone  7.5 mg Oral BID  . carvedilol  12.5 mg Oral BID  . citalopram  20 mg Oral Daily  . dexamethasone  4 mg Intravenous Q6H  . heparin  5,000 Units Subcutaneous Q8H  . insulin aspart  0-9 Units Subcutaneous Q4H  . magnesium chloride  1 tablet Oral Daily  . pantoprazole  40 mg Oral Daily  . simvastatin  20 mg Oral QPM  . sodium chloride flush  3 mL Intravenous Q12H   Continuous Infusions: . lactated ringers 50 mL/hr at 01/06/16 2100    Procedures/Studies: Ct Head Wo Contrast  Result Date: 01/01/2016 CLINICAL DATA:  75 year old female with a history of altered mental status EXAM: CT HEAD WITHOUT CONTRAST TECHNIQUE: Contiguous axial images were obtained from the base of the skull through the vertex without intravenous contrast. COMPARISON:  08/17/2012, CT of the same date  FINDINGS: Extensive confluent hypodensity in the right subcortical white matter extending to the ventricular margin. Gray-white differentiation maintained. There is a  heterogeneously dense mass in the right parieto-occipital region, extending to the inner table of the calvarium, measuring 3.4 cm by 2.9 cm. Local mass effect, with distortion of the posterior horn of the right lateral ventricle. The ventricles are enlarged compared to the prior CT. No intracranial hemorrhage. Expanded appearance of the posterior right corpus callosum, which is hypodense. Unremarkable appearance of the calvarium without acute fracture or aggressive lesion. Unremarkable appearance of the scalp soft tissues. Unremarkable appearance of the bilateral orbits. Mastoid air cells are clear. No significant paranasal sinus disease. IMPRESSION: Evidence of vasogenic edema of the right cerebral white matter secondary to mass (3.5 cm x 2.9 cm) in the right parieto-occipital region. Given the appearance of the chest CT performed on today's date, this most likely represents metastatic disease. Further evaluation with contrast-enhanced MR is indicated. Expanded appearance of the posterior right corpus callosum, potentially edema and/or tumor. Increasing size of the ventricular system compare to the prior CT, potentially representing developing hydrocephalus. These results were called by telephone at the time of interpretation on 01/01/2016 at 12:39 pm to Dr. Lavonia Drafts , who verbally acknowledged these results. Signed, Dulcy Fanny. Earleen Newport, DO [facility] Radiology Electronically Signed   By: Corrie Mckusick D.O.   On: 01/01/2016 12:40   Ct Chest W Contrast  Result Date: 01/01/2016 CLINICAL DATA:  Recent altered mental status with mass on recent chest x-ray EXAM: CT CHEST WITH CONTRAST TECHNIQUE: Multidetector CT imaging of the chest was performed during intravenous contrast administration. CONTRAST:   61m ISOVUE-300 IOPAMIDOL (ISOVUE-300) INJECTION 61% COMPARISON:  Plain film from earlier in the same day FINDINGS: Cardiovascular: Thoracic aorta demonstrates some calcific changes without evidence of aneurysm or dissection. The pulmonary artery as visualized is within normal limits. Coronary calcifications are noted. The cardiac structures are otherwise within normal limits. Mediastinum/Nodes: The thoracic inlet demonstrates bilateral calcified thyroid nodules. The largest of these lies on the right measuring approximately 2.5 cm in greatest dimension. These are stable from a prior exam from 2014. Fluid level is noted within the esophagus which may be related to some distal narrowing. No significant hilar or mediastinal adenopathy is noted. Lungs/Pleura: Mild dependent atelectasis is noted in the lower lobes. Small bilateral pleural effusions are seen. In the left lower lobe abutting the major fissure, there is a 6.0 by 3.7 by 5.5 cm soft tissue mass lesion. On the sagittal reconstructions, the lesion appears to extend across the minor fissure into the upper lobe. It envelops branches of the left lower lobe pulmonary artery. There is also impingement upon the lower lobe bronchial tree. These changes are consistent with primary pulmonary neoplasm. Upper Abdomen: Within normal limits. Musculoskeletal: Degenerative changes of the thoracic spine are noted. 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 Electronically Signed   By: MInez CatalinaM.D.   On: 01/01/2016 12:39   Mr BJeri CosWWCContrast  Result Date: 01/05/2016 CLINICAL DATA:  Acute presentation with altered mental status 4 days ago. Abnormal chest radiograph. Brain mass. EXAM: MRI HEAD WITHOUT AND WITH CONTRAST TECHNIQUE: Multiplanar, multiecho pulse sequences of the brain and surrounding structures were obtained without and with intravenous contrast. CONTRAST:  127mMULTIHANCE GADOBENATE DIMEGLUMINE 529 MG/ML IV  SOLN COMPARISON:  CT 01/01/2016 FINDINGS: In the right occipital lobe, there is a 4 cm mass with internal necrosis measuring up to 4 cm in diameter. There is associated vasogenic edema within the right temporal, occipital and parietal regions. There is mass  effect with flattening of the occipital horn of the right lateral ventricle. There is right-to-left shift of 3 mm. Enhancement extending anterior from the main tumor could possibly be a minimal enhancement in the collapsed occipital horn. No other a pen the mall enhancement is seen. No second mass lesion is evident. The brain otherwise shows chronic small-vessel ischemic changes of the cerebral hemispheric white matter. No skull or skullbase lesion is seen. No hydrocephalus. No extra-axial fluid collection. No pituitary mass. No inflammatory sinus disease. IMPRESSION: 4 cm mass in the right occipital lobe with internal necrosis. Vasogenic edema affecting the right hemisphere. Right-to-left shift of 3 mm. In the setting of what looks like a primary lung cancer, metastatic disease is the most likely diagnosis. Lesion could represent a primary brain malignancy, but that is less likely. Electronically Signed   By: Nelson Chimes M.D.   On: 01/05/2016 12:58   Dg Chest Port 1 View  Result Date: 01/01/2016 CLINICAL DATA:  Altered mental status, recent altered mental status EXAM: PORTABLE CHEST 1 VIEW COMPARISON:  02/10/2005 FINDINGS: Cardiac shadow is mildly enlarged. There is 7 cm mass lesions superimposed over the left hilar region new from the prior exam. No bony abnormality is seen. IMPRESSION: Changes consistent with a large left lung mass. CT of the chest is recommended for further evaluation. Electronically Signed   By: Inez Catalina M.D.   On: 01/01/2016 11:08    Daviana Haymaker, DO  [facility] Pager [phone]  If 7PM-7AM, please contact night-coverage [email or web address] Password [email or web address] 01/07/2016, 1:55 PM   LOS: 6 days

## 2016-01-07 NOTE — Progress Notes (Signed)
Speech Language Pathology Treatment: Cognitive-Linquistic  Patient Details Name: MARQUESA RATH MRN: [id number] DOB: Dec 27, 1940 Today's Date: 01/07/2016 Time: 9379-0240 SLP Time Calculation (min) (ACUTE ONLY): 18 min  Assessment / Plan / Recommendation Clinical Impression  Dtr present for treatment: reviewed results of cognitive assessment.  Discussed and practiced ways to compensate for visual perceptual deficits.  Pt with better ability to visually identify common objects today (60% accuracy); mod cues to use physical manipulation to assist with I.D.  Orientation with 75% accuracy.  Inhibited perseveratory responses with mod verbal cues.  D/W dtr ways to handle disorientation with gentle redirection.  Rec continued f/u while in acute care.  Dtr in agreement.    HPI HPI: 75 y.o. female with medical history significant for tobacco abuse, hypertension, and GERD who presented for evaluation of altered mental status. Patient had reportedly been complaining of difficulty focusing and vision changes going back approximately 6 weeks. Also over the past 6 weeks, her daughter is noted episodes in which the patient appears to "stare off." Patient was found  to be sitting on the floor after an apparent fall on day of admission. She was more confused than she had been, asking nonsensical questions, and was taken to the [facility] emergency department for evaluation.  In the ED chest x-ray featured a conspicuous mass in the left lung. CT of the head and chest were obtained, revealing vasogenic edema surrounding a brain mass in the right parieto-occipital region, suspected secondary to lung primary, and with interval increase in the ventricular system suggestive of developing hydrocephalus. Neurosurgery was consulted by the ED physician and advised a medical admission to [facility].  Patient was then transferred to [place] with current plan of care     Recommendations                 Plan: Continue with current plan of care     GO                Juan Quam Laurice 01/07/2016, 1:17 PM

## 2016-01-08 DIAGNOSIS — Z515 Encounter for palliative care: Secondary | ICD-10-CM

## 2016-01-08 DIAGNOSIS — E860 Dehydration: Secondary | ICD-10-CM

## 2016-01-08 DIAGNOSIS — Z7189 Other specified counseling: Secondary | ICD-10-CM

## 2016-01-08 LAB — BASIC METABOLIC PANEL
Anion gap: 10 (ref 5–15)
BUN: 16 mg/dL (ref 6–20)
CALCIUM: 8.4 mg/dL — AB (ref 8.9–10.3)
CO2: 27 mmol/L (ref 22–32)
CREATININE: 0.93 mg/dL (ref 0.44–1.00)
Chloride: 100 mmol/L — ABNORMAL LOW (ref 101–111)
GFR calc non Af Amer: 58 mL/min — ABNORMAL LOW (ref >60)
Glucose, Bld: 103 mg/dL — ABNORMAL HIGH (ref 65–99)
Potassium: 3.6 mmol/L (ref 3.5–5.1)
SODIUM: 137 mmol/L (ref 135–145)

## 2016-01-08 LAB — GLUCOSE, CAPILLARY
GLUCOSE-CAPILLARY: 106 mg/dL — AB (ref 65–99)
GLUCOSE-CAPILLARY: 99 mg/dL (ref 65–99)
Glucose-Capillary: 114 mg/dL — ABNORMAL HIGH (ref 65–99)
Glucose-Capillary: 119 mg/dL — ABNORMAL HIGH (ref 65–99)
Glucose-Capillary: 122 mg/dL — ABNORMAL HIGH (ref 65–99)
Glucose-Capillary: 142 mg/dL — ABNORMAL HIGH (ref 65–99)

## 2016-01-08 LAB — MAGNESIUM: MAGNESIUM: 1.7 mg/dL (ref 1.7–2.4)

## 2016-01-08 MED ORDER — HALOPERIDOL LACTATE 2 MG/ML PO CONC
2.0000 mg | Freq: Once | ORAL | Status: DC
  Filled 2016-01-08: qty 1

## 2016-01-08 MED ORDER — DEXAMETHASONE SODIUM PHOSPHATE 4 MG/ML IJ SOLN
4.0000 mg | Freq: Three times a day (TID) | INTRAMUSCULAR | Status: DC
  Administered 2016-01-08 – 2016-01-09 (×2): 4 mg via INTRAVENOUS
  Filled 2016-01-08 (×2): qty 1

## 2016-01-08 MED ORDER — HALOPERIDOL LACTATE 5 MG/ML IJ SOLN
2.0000 mg | Freq: Four times a day (QID) | INTRAMUSCULAR | Status: DC | PRN
  Administered 2016-01-08: 2 mg via INTRAVENOUS
  Filled 2016-01-08: qty 1

## 2016-01-08 NOTE — Consult Note (Signed)
Consultation Note Date: 01/08/2016   Patient Name: Alexis Buck  DOB: 21-Jan-1941  MRN: [id number]  Age / Sex: 75 y.o., female  PCP: Lavera Guise, MD Referring Physician: Orson Eva, MD  Reason for Consultation: Establishing goals of care  HPI/Patient Profile: 75 y.o. female  with past medical history of Tobacco, hypertension, and GERD admitted on 01/01/2016 with altered mental status and visual changes.  She has subsequently been discovered to have lung mass as well as parieto-occipital mass with features consistent with metastatic lung cancer. Palliative consulted for goals of care.   Clinical Assessment and Goals of Care: I met today with patient and her daughter, Alexis Buck.  Alexis Buck reports being sleepy this morning. She is noted to have sundowning at night and states that she does not usually get up until mid morning. She did participate to some extent in conversation, but most of conversation was had with her daughter. Patient did acknowledge that she agreed with plan when we reviewed at the end of the encounter.  Her daughter reports that her mother has been an independent woman who is very social and enjoys spending time with people. She reports that being able to be at home and spending time with her family the most important things to her.  We discussed her clinical course to this point in time in her daughter reports having a good understanding of her condition. She states that she and her family are in agreement with plan to avoid aggressive interventions, and the focus of her mother's care moving forward should be on quality of life and feeling as well as possible understanding she has a terminal disease with very limited prognosis.  She has had the opportunity to talk with neurosurgery as well as radiation oncology and patient and her family are not interested in pursuing any other workup nor  treatment.  We discussed pathways moving forward including enrollment in hospice versus trial of rehabilitation to see if she can regain any functional status. Her daughter reports that the eventual goal is to transition either home or to assisted living with the support of hospice, however, she feels that her mother would benefit from a trial of rehabilitation to maximize her potential prior to transitioning either home or to assisted living.  SUMMARY OF RECOMMENDATIONS    We reviewed a MOST form and discussed how to develop plan of care to focus on continuing therapies that would maximize chance of being well enough to return home and limiting therapies not in line with this goal.  I discussed with patient and her daughter regarding heroic interventions at the end-of-life. There is agreement this would not be in line with prior expressed wishes for a natural death or be likely to lead to getting well enough to go back home. Family in agreement with changing CODE STATUS to DO NOT RESUSCITATE.  We completed MOST form today. DNR, Limited additional interventions, IVF and ABX to be determined at time of intervention, no feeding tube.    Patient's daughter would like  to pursue placement at skilled facility for rehabilitation in order to maximize functional status and allow family time to set up plan for hospice support.  Eventual goal is to transition either home or to assisted living with hospice support. The goal of rehospitalization with limited additional interventions is only during the time of her rehabilitation stay.  When she transitions from rehabilitation either to assisted living or home, goal be full comfort measures with hospice support.  Code Status/Advance Care Planning:  DNR   Symptom Management:   Sundowning: Agree with addition of Seroquel. Could also consider transition to Zyprexa if Seroquel ineffective.  Also likely benefit from working to taper steroids to the lowest  effective dose.  Would consider transition to dexamethasone 4 mg twice a day.  Would also recommend minimizing overnight interventions (such as glucose checks) in order to minimize interruptions to sleep-wake cycle.  Palliative Prophylaxis:   Delirium Protocol  Psycho-social/Spiritual:   Desire for further Chaplaincy support:no  Additional Recommendations: Education on Hospice  Prognosis:   Weeks to months. With her metastatic disease and no plans for aggressive interventions, I would anticipate her prognosis to be less than 6 months if her disease follows its natural course. She should qualify for hospice support when so desired.  Discharge Planning: [facility] for rehab with Palliative care service follow-up.  Goal with rehabilitation is to maximize functional status while allowing family time to make arrangements to set up hospice either at home with hired assistance or to assisted living with hospice support.     Primary Diagnoses: Present on Admission: . Essential hypertension . Lung mass . Brain metastasis ([place]) . Dehydration . Altered mental status . Hypokalemia . Leukocytosis . Hypercalcemia   I have reviewed the medical record, interviewed the patient and family, and examined the patient. The following aspects are pertinent.  Past Medical History:  Diagnosis Date  . Benign neoplasm of breast 2009  . Hypertension    Social History   Social History  . Marital status: Single    Spouse name: N/A  . Number of children: N/A  . Years of education: N/A   Social History Main Topics  . Smoking status: Current Every Day Smoker    Packs/day: 1.00    Years: 30.00  . Smokeless tobacco: None  . Alcohol use Yes  . Drug use: No  . Sexual activity: Not Asked   Other Topics Concern  . None   Social History Narrative  . None   Family History  Problem Relation Age of Onset  . Brain cancer Neg Hx    Scheduled Meds: . busPIRone  7.5 mg Oral BID  .  carvedilol  12.5 mg Oral BID  . citalopram  20 mg Oral Daily  . dexamethasone  4 mg Intravenous Q6H  . heparin  5,000 Units Subcutaneous Q8H  . insulin aspart  0-9 Units Subcutaneous Q4H  . magnesium chloride  1 tablet Oral Daily  . pantoprazole  40 mg Oral Daily  . QUEtiapine  25 mg Oral QHS  . simvastatin  20 mg Oral QPM  . sodium chloride flush  3 mL Intravenous Q12H   Continuous Infusions: . lactated ringers 50 mL/hr at 01/06/16 2100   PRN Meds:.acetaminophen **OR** acetaminophen, bisacodyl, haloperidol lactate, HYDROcodone-acetaminophen, ipratropium-albuterol, ondansetron **OR** ondansetron (ZOFRAN) IV, polyethylene glycol Medications Prior to Admission:  Prior to Admission medications   Medication Sig Start Date End Date Taking? Authorizing Provider  aspirin EC 81 MG tablet Take 81 mg by mouth daily.  Historical Provider, MD  busPIRone (BUSPAR) 7.5 MG tablet Take 7.5 mg by mouth 2 (two) times daily.    Historical Provider, MD  carvedilol (COREG) 12.5 MG tablet Take 12.5 mg by mouth 2 (two) times daily.    Historical Provider, MD  citalopram (CELEXA) 40 MG tablet Take 40 mg by mouth daily.    Historical Provider, MD  hydrochlorothiazide (HYDRODIURIL) 12.5 MG tablet Take 12.5 mg by mouth every morning.    Historical Provider, MD  Multiple Vitamins-Minerals (CENTRUM SILVER 50+WOMEN) TABS Take by mouth.    Historical Provider, MD  omeprazole (PRILOSEC) 20 MG capsule Take 20 mg by mouth daily.    Historical Provider, MD  simvastatin (ZOCOR) 20 MG tablet Take 20 mg by mouth every evening.    Historical Provider, MD   No Known Allergies Review of Systems  Constitutional: Positive for appetite change and fatigue.  Psychiatric/Behavioral: Positive for confusion and sleep disturbance.    Physical Exam  General:  Pt is alert, follows commands appropriately, not in acute distress  HEENT: No icterus, No thrush, No neck mass, [place]/AT  Cardiovascular: RRR, S1/S2, no rubs, no  gallops  Respiratory: Bibasilar rales. No wheezing. Good air movement.  Abdomen: Soft/+BS, non tender, non distended, no guarding  Extremities: No edema, No lymphangitis, No petechiae, No rashes, no synovitis  Vital Signs: BP (!) 159/82 (BP Location: Left Arm)   Pulse 62   Temp 97.4 F (36.3 C) (Oral)   Resp 17   Ht 4' 11"  (1.499 m)   Wt 73.3 kg (161 lb 8 oz)   LMP  (LMP Unknown)   SpO2 96%   BMI 32.62 kg/m  Pain Assessment: PAINAD   Pain Score: 0-No pain   SpO2: SpO2: 96 % O2 Device:SpO2: 96 % O2 Flow Rate: .O2 Flow Rate (L/min): 2 L/min  IO: Intake/output summary:  Intake/Output Summary (Last 24 hours) at 01/08/16 0911 Last data filed at 01/08/16 0810  Gross per 24 hour  Intake             1400 ml  Output              200 ml  Net             1200 ml    LBM: Last BM Date: 01/07/16 Baseline Weight: Weight: 72.3 kg (159 lb 6.3 oz) Most recent weight: Weight: 73.3 kg (161 lb 8 oz)     Palliative Assessment/Data: 40%   Flowsheet Rows   Flowsheet Row Most Recent Value  Intake Tab  Referral Department  Critical care  Unit at Time of Referral  Intermediate Care Unit  Palliative Care Primary Diagnosis  Cancer  Date Notified  01/06/16  Palliative Care Type  New Palliative care  Reason for referral  Clarify Goals of Care  Date of Admission  01/01/16  Date first seen by Palliative Care  01/07/16  # of days Palliative referral response time  1 Day(s)  # of days IP prior to Palliative referral  5  Clinical Assessment  Palliative Performance Scale Score  40%  Pain Max last 24 hours  4  Pain Min Last 24 hours  0  Psychosocial & Spiritual Assessment  Palliative Care Outcomes  Patient/Family meeting held?  Yes  Who was at the meeting?  Patient, daughter  Palliative Care Outcomes  Clarified goals of care, Changed CPR status, Completed durable DNR, Counseled regarding hospice, Improved non-pain symptom therapy  Patient/Family wishes: Interventions discontinued/not  started   Mechanical Ventilation, Hemodialysis, Vasopressors,  Tube feedings/TPN, Trach, PEG      Time In: 700 Time Out: 800 Time Total: 60 Greater than 50%  of this time was spent counseling and coordinating care related to the above assessment and plan.  Signed by: Micheline Rough, MD   Please contact Palliative Medicine Team phone at [phone] for questions and concerns.  For individual provider: See Shea Evans

## 2016-01-08 NOTE — Progress Notes (Addendum)
RN enters room at request of the nursing tech to assist. Pt pacing room, seems very agitated. States "just leave me alone or I swear I'll kill you ladies". Tech asks if it is ok to check pt's blood sugar. Pt again states "Leave me alone or I will kill you all." Pt assisted back to bed. Will page [facility] to make them aware of situation and refusal of medications. RN to continue to assess.

## 2016-01-08 NOTE — Care Management Note (Signed)
Case Management Note  Patient Details  Name: Alexis Buck MRN: [id number] Date of Birth: [birth year]  Subjective/Objective:    Metastatic disease                Action/Plan: Discharge Planning:  NCM and Occupational Therapist spoke to pt and grand-dtr, [name] at bedside. Pt in room  yelling out and stating imaginary someone is in her room. She is so afraid. Grand-dtr is in the room. Pt was able to calm down and sit in her chair by bedside.  Spoke to pt's dtr [name] who just arrived to room. Made her aware of pt increase anxiety. Unit RN in room with pt. Attending made aware and in pt's room. CSW following for SNF placement.     Expected Discharge Date:  01/09/2016               Expected Discharge Plan:  Skilled Nursing Facility  In-House Referral:  Clinical Social Work  Discharge planning Services  CM Consult  Post Acute Care Choice:  NA Choice offered to:  NA  DME Arranged:  N/A DME Agency:  NA  HH Arranged:  NA HH Agency:  NA  Status of Service:  Completed, signed off  If discussed at [employer] of Stay Meetings, dates discussed:    Additional Comments:  Erenest Rasher, RN 01/08/2016, 2:07 PM

## 2016-01-08 NOTE — Progress Notes (Signed)
PROGRESS NOTE  Alexis Buck:[id number] DOB: 01-Jun-1940 DOA: 01/01/2016 PCP: Lavera Guise, MD Brief History: 75 y.o.femalewith medical history significant for tobacco abuse, hypertension, and GERD who presentedfor evaluation of altered mental status. Patient had reportedly been complaining of difficulty focusing and vision changes going back approximately 6 weeks. Also over the past 6 weeks, her daughter is noted episodes in which the patient appears to "stare off." Patient was found to be sitting on the floor after an apparent fall on day of admission. She was more confused than she had been, asking nonsensical questions, and was taken to the [facility] emergency department for evaluation. In the ED chest x-ray featured a conspicuous mass in the left lung. CT of the head and chest were obtained, revealing vasogenic edema surrounding a brain mass in the right parieto-occipital region, suspected secondary to lung primary, and with interval increase in the ventricular system suggestive of developing hydrocephalus. Neurosurgery was consulted by the ED physician and advised a medical admission to [facility]. Patient was then transferred to [facility] for evaluation by neurosurgery. MRI was attempted to assist in formulating treatment plan for Neurosurgery but was unable to be successfully completed secondary to patient's anxiety. MRI under sedation  done on 8/15--please see results below.  After neurosurgical and pulmonary recommendations were made, the patient's daughter and son requested palliative medicine consult for goals of care. 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 -appreciate neurosurgery followup -appreciate pulmonary consult -long discussion with daughter--she does not want any invasive procedures to be done and is favoring an approach more directed toward comfort -palliative medicine consult appreciated-->SNF for rehab-->then home or ALF with hospice--goal be full comfort measures with hospice support.  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) - SCr 1.83 on admission with no prior available for comparison  - Suspect there is an acute component given recent poor oral intake and s/s of dehydration  - Patient having good PO intake -serum creatinine improved -suspect baseline creatinine 0.9-1.2  Hypokalemia and Hypomagnesemia  - Serum potassium 2.7 on admission  - Likely secondary to HCTZ, will hold  - Magnesium level low; 2g IV given at start of admission  - repleted  Hypercalcemia  - Serum calcium 11.7 on admission but decreased to 9.6 - Suspected secondary to malignant process  - Continue rehydration  with IVF   Leukocytosis  -  WBC 24,500 on admission with neutrophilic predominance  - Suspect this is secondary to the malignant process rather than infection  - Blood and urine cultures--neg - Watching off of abx for now-->remains stable  Hypertension - Managed with Coreg and HCTZ at home  - Hold HCTZ 2/2 hypokalemia - continue Coreg with monitoring  - blood pressures reasonably controlled   Disposition Plan: Home with hospice vs CIR Family Communication: Daughter updatedat bedside--Total time spent 35 minutes.  Greater than 50% spent face to face counseling and coordinating care.   Consultants: Pulmonary, neurosurgery, palliative med  Code Status: FULL   DVT Prophylaxis: [place] Heparin    Procedures: As Listed in Progress Note Above  Antibiotics: None    Subjective: Patient denies fevers, chills, headache, chest pain, dyspnea, nausea, vomiting, diarrhea, abdominal pain, dysuria, hematuria, hematochezia, and melena.   Objective: Vitals:   01/07/16 2123 01/08/16 0413 01/08/16 1336 01/08/16 1338  BP: 126/74 (!) 159/82 (!) 143/104 (!) 164/79  Pulse: 77 62 75 74  Resp: '18 17 16   '$ Temp: 98.8 F (37.1 C) 97.4 F (36.3 C) 98.2 F (36.8 C)   TempSrc: Oral Oral Oral   SpO2: 97% 96% 100%   Weight:  73.3 kg (161 lb 8 oz)    Height:        Intake/Output Summary (Last 24 hours) at 01/08/16 1748 Last data filed at 01/08/16 0810  Gross per 24 hour  Intake              880 ml  Output                0 ml  Net              880 ml   Weight change: 0.856 kg (1 lb 14.2 oz) Exam:   General:  Pt is alert, follows commands appropriately, not in acute distress  HEENT: No icterus, No thrush, No neck mass, [place]/AT  Cardiovascular: RRR, S1/S2, no rubs, no gallops  Respiratory: Bibasilar crackles, no rhonchi  Abdomen: Soft/+BS, non tender, non distended, no guarding  Extremities: No edema, No lymphangitis, No petechiae, No rashes, no synovitis   Data Reviewed: I have personally  reviewed following labs and imaging studies Basic Metabolic Panel:  Recent Labs Lab 01/01/16 2206  01/02/16 0805 01/03/16 1451 01/05/16 1722 01/07/16 0319 01/08/16 0315  NA  --   < > 137 133* 136 136 137  K  --   < > 3.3* 3.3* 3.9 3.5 3.6  CL  --   < > 100* 97* 98* 99* 100*  CO2  --   < > '26 27 29 28 27  '$ GLUCOSE  --   < > 95 103* 100* 106* 103*  BUN  --   < > 29* 29* 21* 17 16  CREATININE  --   < > 1.22* 1.04* 0.91 0.86 0.93  CALCIUM 10.1  < > 9.4 9.6 9.1 8.4* 8.4*  MG 1.6*  --   --   --   --   --  1.7  < > = values in this interval not displayed. Liver Function Tests:  Recent Labs Lab 01/02/16 0025 01/02/16 0805 01/05/16 1722  AST '21 26 27  '$ ALT '16 18 30  '$ ALKPHOS 46 56 48  BILITOT 1.0 0.7 0.6  PROT 5.0* 5.7* 5.5*  ALBUMIN 2.2* 2.4* 2.4*   No results for input(s): LIPASE, AMYLASE in the last 168 hours. No results for input(s):  AMMONIA in the last 168 hours. Coagulation Profile:  Recent Labs Lab 01/02/16 0025  INR 1.32   CBC:  Recent Labs Lab 01/02/16 0025 01/02/16 0805 01/05/16 1824  WBC 16.1* 16.7* 15.3*  NEUTROABS  --  15.8* 14.4*  HGB 14.0 14.3 15.0  HCT 40.0 41.6 43.6  MCV 88.5 89.3 90.3  PLT 259 274 317   Cardiac Enzymes: No results for input(s): CKTOTAL, CKMB, CKMBINDEX, TROPONINI in the last 168 hours. BNP: Invalid input(s): POCBNP CBG:  Recent Labs Lab 01/08/16 0136 01/08/16 0408 01/08/16 0802 01/08/16 1145 01/08/16 1619  GLUCAP 106* 114* 99 119* 122*   HbA1C: No results for input(s): HGBA1C in the last 72 hours. Urine analysis:    Component Value Date/Time   COLORURINE AMBER (A) 01/01/2016 1047   APPEARANCEUR HAZY (A) 01/01/2016 1047   LABSPEC 1.029 01/01/2016 1047   PHURINE 5.0 01/01/2016 1047   GLUCOSEU NEGATIVE 01/01/2016 1047   HGBUR NEGATIVE 01/01/2016 1047   BILIRUBINUR NEGATIVE 01/01/2016 1047   KETONESUR NEGATIVE 01/01/2016 1047   PROTEINUR 100 (A) 01/01/2016 1047   NITRITE NEGATIVE 01/01/2016 1047   LEUKOCYTESUR  NEGATIVE 01/01/2016 1047   Sepsis Labs: '@LABRCNTIP'$ (procalcitonin:4,lacticidven:4) ) Recent Results (from the past 240 hour(s))  Blood Culture (routine x 2)     Status: None   Collection Time: 01/01/16 10:47 AM  Result Value Ref Range Status   Specimen Description BLOOD RIGHT ANTECUBITAL  Final   Special Requests BOTTLES DRAWN AEROBIC AND ANAEROBIC  [place]  Final   Culture NO GROWTH 5 DAYS  Final   Report Status 01/06/2016 FINAL  Final  Blood Culture (routine x 2)     Status: None   Collection Time: 01/01/16 10:47 AM  Result Value Ref Range Status   Specimen Description BLOOD LEFT HAND  Final   Special Requests BOTTLES DRAWN AEROBIC AND ANAEROBIC  3CC  Final   Culture NO GROWTH 5 DAYS  Final   Report Status 01/06/2016 FINAL  Final  Urine culture     Status: None   Collection Time: 01/01/16 10:47 AM  Result Value Ref Range Status   Specimen Description URINE, RANDOM  Final   Special Requests NONE  Final   Culture NO GROWTH Performed at [facility]   Final   Report Status 01/02/2016 FINAL  Final  MRSA PCR Screening     Status: None   Collection Time: 01/01/16  8:14 PM  Result Value Ref Range Status   MRSA by PCR NEGATIVE NEGATIVE Final    Comment:        The GeneXpert MRSA Assay (FDA approved for NASAL specimens only), is one component of a comprehensive MRSA colonization surveillance program. It is not intended to diagnose MRSA infection nor to guide or monitor treatment for MRSA infections.      Scheduled Meds: . busPIRone  7.5 mg Oral BID  . carvedilol  12.5 mg Oral BID  . citalopram  20 mg Oral Daily  . dexamethasone  4 mg Intravenous Q8H  . heparin  5,000 Units Subcutaneous Q8H  . insulin aspart  0-9 Units Subcutaneous Q4H  . magnesium chloride  1 tablet Oral Daily  . pantoprazole  40 mg Oral Daily  . QUEtiapine  25 mg Oral QHS  . simvastatin  20 mg Oral QPM  . sodium chloride flush  3 mL Intravenous Q12H   Continuous Infusions: . lactated ringers 50  mL/hr at 01/06/16 2100    Procedures/Studies: Ct Head Wo Contrast  Result Date: 01/01/2016 CLINICAL DATA:  75 year old female with a history of altered mental status EXAM: CT HEAD WITHOUT CONTRAST TECHNIQUE: Contiguous axial images were obtained from the base of the skull through the vertex without intravenous contrast. COMPARISON:  08/17/2012, CT of the same date FINDINGS: Extensive confluent hypodensity in the right subcortical white matter extending to the ventricular margin. Gray-white differentiation maintained. There is a heterogeneously dense mass in the right parieto-occipital region, extending to the inner table of the calvarium, measuring 3.4 cm by 2.9 cm. Local mass effect, with distortion of the posterior horn of the right lateral ventricle. The ventricles are enlarged compared to the prior CT. No intracranial hemorrhage. Expanded appearance of the posterior right corpus callosum, which is hypodense. Unremarkable appearance of the calvarium without acute fracture or aggressive lesion. Unremarkable appearance of the scalp soft tissues. Unremarkable appearance of the bilateral orbits. Mastoid air cells are clear. No significant paranasal sinus disease. IMPRESSION: Evidence of vasogenic edema of the right cerebral white matter secondary to mass (3.5 cm x 2.9 cm) in the right parieto-occipital region. Given the appearance of the chest CT performed on today's date, this most likely represents metastatic disease. Further evaluation with contrast-enhanced MR is indicated. Expanded appearance of the posterior right corpus callosum, potentially edema and/or tumor. Increasing size of the ventricular system compare to the prior CT, potentially representing developing hydrocephalus. These results were called by telephone at the time of interpretation on 01/01/2016 at 12:39 pm to Dr. Lavonia Drafts , who verbally acknowledged these results. Signed, Dulcy Fanny. Earleen Newport, DO [facility] Radiology Electronically Signed   By: Corrie Mckusick D.O.   On: 01/01/2016 12:40   Ct Chest W Contrast  Result Date: 01/01/2016 CLINICAL DATA:  Recent altered mental status with mass on recent chest x-ray EXAM: CT CHEST WITH CONTRAST TECHNIQUE: Multidetector CT imaging of the chest was performed during intravenous contrast administration. CONTRAST:  6m ISOVUE-300 IOPAMIDOL (ISOVUE-300) INJECTION 61% COMPARISON:  Plain film from earlier in the same day FINDINGS: Cardiovascular: Thoracic aorta demonstrates some calcific changes without evidence of aneurysm or dissection. The pulmonary artery as visualized is within normal limits. Coronary calcifications are noted. The cardiac structures are otherwise within normal limits. Mediastinum/Nodes: The thoracic inlet demonstrates bilateral calcified thyroid nodules. The largest of these lies on the right measuring approximately 2.5 cm in greatest dimension. These are stable from a prior exam from 2014. Fluid level is noted within the esophagus which may be related to some distal narrowing. No significant hilar or mediastinal adenopathy is noted. Lungs/Pleura: Mild dependent atelectasis is noted in the lower lobes. Small bilateral pleural effusions are seen. In the left lower lobe abutting the major fissure, there is a 6.0 by 3.7 by 5.5 cm soft tissue mass lesion. On the sagittal reconstructions, the lesion appears to extend across the minor fissure into the upper lobe. It envelops branches of the left lower lobe pulmonary artery. There is also impingement upon the lower lobe bronchial tree. These changes are consistent with primary pulmonary neoplasm. Upper Abdomen: Within normal limits. Musculoskeletal: Degenerative changes of the thoracic spine are noted. 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 Electronically Signed   By: MInez CatalinaM.D.   On: 01/01/2016 12:39   Mr BJeri CosWQA Contrast  Result Date: 01/05/2016 CLINICAL DATA:  Acute presentation with altered mental status 4 days ago. Abnormal chest radiograph. Brain mass. EXAM: MRI HEAD WITHOUT AND WITH CONTRAST TECHNIQUE: Multiplanar, multiecho pulse sequences of the brain and  surrounding structures were obtained without and with intravenous contrast. CONTRAST:  46m MULTIHANCE GADOBENATE DIMEGLUMINE 529 MG/ML IV SOLN COMPARISON:  CT 01/01/2016 FINDINGS: In the right occipital lobe, there is a 4 cm mass with internal necrosis measuring up to 4 cm in diameter. There is associated vasogenic edema within the right temporal, occipital and parietal regions. There is mass effect with flattening of the occipital horn of the right lateral ventricle. There is right-to-left shift of 3 mm. Enhancement extending anterior from the main tumor could possibly be a minimal enhancement in the collapsed occipital horn. No other a pen the mall enhancement is seen. No second mass lesion is evident. The brain otherwise shows chronic small-vessel ischemic changes of the cerebral hemispheric white matter. No skull or skullbase lesion is seen. No hydrocephalus. No extra-axial fluid collection. No pituitary mass. No inflammatory sinus disease. IMPRESSION: 4 cm mass in the right occipital lobe with internal necrosis. Vasogenic edema affecting the right hemisphere. Right-to-left shift of 3 mm. In the setting of what looks like a primary lung cancer, metastatic disease is the most likely diagnosis. Lesion could represent a primary brain malignancy, but that is less likely. Electronically Signed   By: MNelson ChimesM.D.   On: 01/05/2016 12:58   Dg Chest Port 1 View  Result Date: 01/01/2016 CLINICAL DATA:  Altered mental status, recent altered mental status EXAM: PORTABLE CHEST 1 VIEW COMPARISON:  02/10/2005 FINDINGS: Cardiac shadow is mildly enlarged. There is 7 cm mass lesions superimposed over the left hilar region new from the prior exam. No bony abnormality  is seen. IMPRESSION: Changes consistent with a large left lung mass. CT of the chest is recommended for further evaluation. Electronically Signed   By: MInez CatalinaM.D.   On: 01/01/2016 11:08    Shaquitta Burbridge, DO  [facility] Pager [phone] If 7PM-7AM, please contact night-coverage [email or web address] Password [email or web address] 01/08/2016, 5:48 PM   LOS: 7 days

## 2016-01-08 NOTE — Consult Note (Signed)
Physical Medicine and Rehabilitation Consult   Reason for Consult: Metastatic brain mass with confusion, visual and balance deficits.  Referring Physician: Dr. Carles Collet   HPI: Alexis Buck is a 75 y.o. female with history of COPD, HTN, GERD who was admitted on 01/01/16 after being found on the floor confused with nonsensical speech. Family reported episode of staring off for 6 weeks with vision changes. She was found to have 4 cm right occipital mass with necrosis and vasogenic edema, LLL lung mass 6.0 cmn with narrowing of LLL bronchus,   Bilateral calcified thyroid nodules and fluid level with narrowing of esophagus.  She was started on decadron for management of edema. Bronchoscopy with biopsy recommended by Dr. Ashok Cordia but family has declined. Dr. Kathyrn Sheriff recommended MRI brain which revealed solitary right parietal lesion likely metastatic and recommended surgical excision which was declined. He recommended SRS but family declined this and has elected on palliative care. Patient with visual perceptive deficits, perseverative speech with cognitive deficits and DOW with unsteady gait. CIR recommended for follow up therapy.    Review of Systems  Unable to perform ROS: Mental acuity      Past Medical History:  Diagnosis Date  . Benign neoplasm of breast 2009  . Hypertension     Past Surgical History:  Procedure Laterality Date  . BREAST BIOPSY Left 2009  . CESAREAN SECTION    . COLONOSCOPY  2006   Dr.KHAN  . POLYPECTOMY  2006  . RADIOLOGY WITH ANESTHESIA N/A 01/05/2016   Procedure: MRI BRAIN WITH AND WITHOUT;  Surgeon: Medication Radiologist, MD;  Location: [facility];  Service: Radiology;  Laterality: N/A;    Family History  Problem Relation Age of Onset  . Brain cancer Neg Hx     Social History:  reports that she has been smoking.  She has a 30.00 pack-year smoking history. She does not have any smokeless tobacco history on file. She reports that she drinks alcohol. She  reports that she does not use drugs.    Allergies: No Known Allergies    Medications Prior to Admission  Medication Sig Dispense Refill  . aspirin EC 81 MG tablet Take 81 mg by mouth daily.    . busPIRone (BUSPAR) 7.5 MG tablet Take 7.5 mg by mouth 2 (two) times daily.    . carvedilol (COREG) 12.5 MG tablet Take 12.5 mg by mouth 2 (two) times daily.    . citalopram (CELEXA) 40 MG tablet Take 40 mg by mouth daily.    . hydrochlorothiazide (HYDRODIURIL) 12.5 MG tablet Take 12.5 mg by mouth every morning.    . Multiple Vitamins-Minerals (CENTRUM SILVER 50+WOMEN) TABS Take by mouth.    Marland Kitchen omeprazole (PRILOSEC) 20 MG capsule Take 20 mg by mouth daily.    . simvastatin (ZOCOR) 20 MG tablet Take 20 mg by mouth every evening.      Home: Home Living Family/patient expects to be discharged to:: Unsure Living Arrangements: Alone Available Help at Discharge: Family, Available 24 hours/day Type of Home: Apartment Home Access: Elevator Home Layout: One level Bathroom Shower/Tub: Chiropodist: Standard Home Equipment: Cane - single point, Civil engineer, contracting, Hand held shower head  Functional History: Prior Function Level of Independence: Independent Comments: Pt enjoys baking  Functional Status:  Mobility: Bed Mobility Overal bed mobility: Needs Assistance Bed Mobility: Supine to Sit Supine to sit: Min guard Sit to supine: Supervision General bed mobility comments: Pt required cues to advance to edge of bed enough  to allow feet to touch the floor.   Transfers Overall transfer level: Needs assistance Equipment used: None Transfers: Sit to/from Stand Sit to Stand: Min guard (pt reaching for therapist to stand.  ) Stand pivot transfers: Min assist General transfer comment: Close min guard for safety Ambulation/Gait Ambulation/Gait assistance: Min guard Ambulation Distance (Feet): 300 Feet Assistive device: None Gait Pattern/deviations: Step-through pattern, Decreased  stride length, Trunk flexed General Gait Details: Pt required cues for reciprocal armswing and upper trunk control.  Pt performed with several standing rest breaks due to DOE.  Pt O2 sats decreased to 87% and continued to fluctuate during tx.  Informed RN and placed patient on 2.5 L of O2.  O2 sats improved to 96%.   Gait velocity: decreased    ADL: ADL Overall ADL's : Needs assistance/impaired Eating/Feeding: Minimal assistance, Sitting Grooming: Wash/dry hands, Wash/dry face, Min guard, Sitting Grooming Details (indicate cue type and reason): min cues due to perseveration  Upper Body Bathing: Minimal assitance, Sitting Upper Body Bathing Details (indicate cue type and reason): requires cues and assist for sequencing and due to perseveration  Lower Body Bathing: Minimal assistance, Sit to/from stand Lower Body Bathing Details (indicate cue type and reason): requires cues and assist due to perseveration and sequencing deficits  Upper Body Dressing : Maximal assistance, Sitting Upper Body Dressing Details (indicate cue type and reason): Pt wads sleeve up when attempting to thread Lt UE through sleeve.  Poor awareness of Lt UE or deficits.  Lower Body Dressing: Maximal assistance, Sit to/from stand Lower Body Dressing Details (indicate cue type and reason): Pt repetitively attempts to turn socks inside out while donning them. Unable to self correct and perseveration on error noted  Toilet Transfer: Moderate assistance, Stand-pivot, BSC Toileting- Clothing Manipulation and Hygiene: Minimal assistance, Sit to/from stand Functional mobility during ADLs: Moderate assistance General ADL Comments: Pt performed sponge bath while EOB.  She fatigued by end of eval   Cognition: Cognition Overall Cognitive Status: Impaired/Different from baseline Arousal/Alertness: Awake/alert Orientation Level: Oriented to person, Oriented to place, Oriented to time, Disoriented to situation Attention: Focused,  Sustained Focused Attention: Impaired Focused Attention Impairment: Verbal basic, Functional basic Sustained Attention: Impaired Sustained Attention Impairment: Verbal basic, Functional basic Memory: Impaired Memory Impairment: Storage deficit, Retrieval deficit Awareness: Impaired Awareness Impairment: Intellectual impairment Problem Solving: Impaired Problem Solving Impairment: Verbal basic, Functional basic Executive Function: Reasoning, Self Monitoring Reasoning: Impaired Reasoning Impairment: Verbal basic, Functional basic Self Monitoring: Impaired Behaviors: Restless, Impulsive, Perseveration Safety/Judgment: Impaired Cognition Arousal/Alertness: Awake/alert Behavior During Therapy: WFL for tasks assessed/performed Overall Cognitive Status: Impaired/Different from baseline Area of Impairment: Orientation, Attention, Memory, Safety/judgement, Following commands, Awareness, Problem solving Orientation Level: Disoriented to, Place, Time, Situation Current Attention Level: Sustained Memory: Decreased short-term memory Following Commands: Follows one step commands inconsistently Safety/Judgement: Decreased awareness of safety, Decreased awareness of deficits Awareness: Intellectual Problem Solving: Slow processing, Decreased initiation, Difficulty sequencing, Requires verbal cues, Requires tactile cues General Comments: Pt sitting on toilet and required cues to use toilet paper to wipe.  PTA handed toilet paper and patient asked what toilet paper was for.  PTA had to provide step by step instructions to patient to wash hands, where to get soap, and how to use paper towels.  PT noted to try and remove bandage from IV site and re-educated to leave site alone.    Blood pressure (!) 159/82, pulse 62, temperature 97.4 F (36.3 C), temperature source Oral, resp. rate 17, height '4\' 11"'$  (1.499 m), weight 73.3 kg (  161 lb 8 oz), SpO2 96 %. Physical Exam  Constitutional: She appears  well-developed.  HENT:  Head: Normocephalic.  Eyes: Pupils are equal, round, and reactive to light.  Neck: Normal range of motion.  Cardiovascular: Normal rate.   Respiratory: Effort normal.  GI: Soft.  Musculoskeletal: Normal range of motion.  Neurological:  Alert. Recognized family members with extra time. Did not know she was in the hospital. Could give me her name. Remembered her address. Otherwise very confused. No idea why she was in the hospital. Asked me to call the police to help find out where she was  Skin: Skin is warm.  Psychiatric:  Cooperative but very confused. Delusional behavior    Results for orders placed or performed during the hospital encounter of 01/01/16 (from the past 24 hour(s))  Glucose, capillary     Status: Abnormal   Collection Time: 01/07/16  4:18 PM  Result Value Ref Range   Glucose-Capillary 132 (H) 65 - 99 mg/dL  Glucose, capillary     Status: Abnormal   Collection Time: 01/07/16  7:15 PM  Result Value Ref Range   Glucose-Capillary 153 (H) 65 - 99 mg/dL   Comment 1 Notify RN   Glucose, capillary     Status: Abnormal   Collection Time: 01/08/16  1:36 AM  Result Value Ref Range   Glucose-Capillary 106 (H) 65 - 99 mg/dL   Comment 1 Notify RN   Basic metabolic panel     Status: Abnormal   Collection Time: 01/08/16  3:15 AM  Result Value Ref Range   Sodium 137 135 - 145 mmol/L   Potassium 3.6 3.5 - 5.1 mmol/L   Chloride 100 (L) 101 - 111 mmol/L   CO2 27 22 - 32 mmol/L   Glucose, Bld 103 (H) 65 - 99 mg/dL   BUN 16 6 - 20 mg/dL   Creatinine, Ser 0.93 0.44 - 1.00 mg/dL   Calcium 8.4 (L) 8.9 - 10.3 mg/dL   GFR calc non Af Amer 58 (L) >60 mL/min   GFR calc Af Amer >60 >60 mL/min   Anion gap 10 5 - 15  Magnesium     Status: None   Collection Time: 01/08/16  3:15 AM  Result Value Ref Range   Magnesium 1.7 1.7 - 2.4 mg/dL  Glucose, capillary     Status: Abnormal   Collection Time: 01/08/16  4:08 AM  Result Value Ref Range   Glucose-Capillary  114 (H) 65 - 99 mg/dL  Glucose, capillary     Status: None   Collection Time: 01/08/16  8:02 AM  Result Value Ref Range   Glucose-Capillary 99 65 - 99 mg/dL   Comment 1 Repeat Test    Comment 2 Document in Chart   Glucose, capillary     Status: Abnormal   Collection Time: 01/08/16 11:45 AM  Result Value Ref Range   Glucose-Capillary 119 (H) 65 - 99 mg/dL   No results found.  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 Does the need for close, 24 hr/day medical supervision in concert with the patient's rehab needs make it unreasonable for this patient to be served in a less intensive setting? No 2. Co-Morbidities requiring supervision/potential complications:   3. Due to bladder management, bowel management and safety, does the patient require 24 hr/day rehab nursing? No 4. Does the patient require coordinated care of a physician, rehab nurse, PT, OT, SLP to address physical and functional deficits in the context of  the above medical diagnosis(es)? No Addressing deficits in the following areas: balance, endurance, locomotion, strength, transferring, bowel/bladder control, bathing, dressing, feeding, grooming and psychosocial support 5. Can the patient actively participate in an intensive therapy program of at least 3 hrs of therapy per day at least 5 days per week? No 6. The potential for patient to make measurable gains while on inpatient rehab is n/a 7. Anticipated functional outcomes upon discharge from inpatient rehab are n/a  with PT, n/a with OT, n/a with SLP. 8. Estimated rehab length of stay to reach the above functional goals is:   9. Does the patient have adequate social supports and living environment to accommodate these discharge functional goals? No 10. Anticipated D/C setting: Other 11. Anticipated post D/C treatments: N/A 12. Overall Rehab/Functional Prognosis: poor  RECOMMENDATIONS: This patient's condition is appropriate  for continued rehabilitative care in the following setting: SNF Patient has agreed to participate in recommended program. N/A Note that insurance prior authorization may be required for reimbursement for recommended care.  Comment: Family prefers venue closer to home. Given prognosis/treatment plan, SNF/hospice is most appropriate.   Meredith Staggers, MD, [facility] 01/08/2016     01/08/2016

## 2016-01-08 NOTE — Progress Notes (Signed)
Physical Therapy Treatment Patient Details Name: Alexis Buck MRN: [id number] DOB: 1941-02-24 Today's Date: 01/08/2016    History of Present Illness 75 y.o. female with medical history significant for tobacco abuse, hypertension, and GERD who presented for evaluation of altered mental status. Patient had reportedly been complaining of difficulty focusing and vision changes going back approximately 6 weeks. Also over the past 6 weeks, her daughter is noted episodes in which the patient appears to "stare off." Patient was found to be sitting on the floor after an apparent fall on day of admission. She was more confused than she had been, asking nonsensical questions, and was taken to the [facility] emergency department for evaluation. In the ED chest x-ray featured a conspicuous mass in the left lung. CT of the head and chest were obtained, revealing vasogenic edema surrounding a brain mass in the right parieto-occipital region, suspected secondary to lung primary, and with interval increase in the ventricular system suggestive of developing hydrocephalus. Neurosurgery was consulted by the ED physician and advised a medical admission to [facility]. Patient was then transferred to [facility].    PT Comments    Pt performed increased mobility and required cues for safety and cognition through out.  DOE 3/4 and required 2.5 L at end of tx. To maintain O2 sats.    Follow Up Recommendations  CIR     Equipment Recommendations       Recommendations for Other Services       Precautions / Restrictions Precautions Precautions: Fall Restrictions Weight Bearing Restrictions: No    Mobility  Bed Mobility Overal bed mobility: Needs Assistance Bed Mobility: Supine to Sit     Supine to sit: Min guard     General bed mobility comments: Pt required cues to advance to edge of bed enough to allow feet to touch the floor.    Transfers Overall transfer level: Needs  assistance Equipment used: None Transfers: Sit to/from Stand Sit to Stand: Min guard (pt reaching for therapist to stand.  )         General transfer comment: Close min guard for safety  Ambulation/Gait Ambulation/Gait assistance: Min guard Ambulation Distance (Feet): 300 Feet Assistive device: None Gait Pattern/deviations: Step-through pattern;Decreased stride length;Trunk flexed Gait velocity: decreased   General Gait Details: Pt required cues for reciprocal armswing and upper trunk control.  Pt performed with several standing rest breaks due to DOE.  Pt O2 sats decreased to 87% and continued to fluctuate during tx.  Informed RN and placed patient on 2.5 L of O2.  O2 sats improved to 96%.     Stairs            Wheelchair Mobility    Modified Rankin (Stroke Patients Only)       Balance     Sitting balance-Leahy Scale: Good       Standing balance-Leahy Scale: Fair                      Cognition Arousal/Alertness: Awake/alert Behavior During Therapy: WFL for tasks assessed/performed Overall Cognitive Status: Impaired/Different from baseline Area of Impairment: Orientation;Attention;Memory;Safety/judgement;Following commands;Awareness;Problem solving Orientation Level: Disoriented to;Place;Time;Situation Current Attention Level: Sustained Memory: Decreased short-term memory Following Commands: Follows one step commands inconsistently Safety/Judgement: Decreased awareness of safety;Decreased awareness of deficits Awareness: Intellectual Problem Solving: Slow processing;Decreased initiation;Difficulty sequencing;Requires verbal cues;Requires tactile cues General Comments: Pt sitting on toilet and required cues to use toilet paper to wipe.  PTA handed toilet paper and patient  asked what toilet paper was for.  PTA had to provide step by step instructions to patient to wash hands, where to get soap, and how to use paper towels.  PT noted to try and remove  bandage from IV site and re-educated to leave site alone.      Exercises      General Comments        Pertinent Vitals/Pain Pain Assessment: No/denies pain    Home Living                      Prior Function            PT Goals (current goals can now be found in the care plan section) Acute Rehab PT Goals Patient Stated Goal: Pt did not state Potential to Achieve Goals: Good Progress towards PT goals: Progressing toward goals    Frequency  Min 3X/week    PT Plan Current plan remains appropriate    Co-evaluation             End of Session Equipment Utilized During Treatment: Gait belt Activity Tolerance: Patient tolerated treatment well;Patient limited by fatigue Patient left: in chair;with call bell/phone within reach;with chair alarm set     Time: 1610-9604 PT Time Calculation (min) (ACUTE ONLY): 16 min  Charges:  $Therapeutic Activity: 8-22 mins                    G Codes:      Cristela Blue 01-28-16, 11:49 AM  Governor Rooks, PTA pager [phone]

## 2016-01-09 DIAGNOSIS — K920 Hematemesis: Secondary | ICD-10-CM | POA: Diagnosis not present

## 2016-01-09 DIAGNOSIS — E860 Dehydration: Secondary | ICD-10-CM | POA: Diagnosis not present

## 2016-01-09 DIAGNOSIS — K219 Gastro-esophageal reflux disease without esophagitis: Secondary | ICD-10-CM | POA: Diagnosis not present

## 2016-01-09 DIAGNOSIS — E785 Hyperlipidemia, unspecified: Secondary | ICD-10-CM | POA: Diagnosis not present

## 2016-01-09 DIAGNOSIS — C7931 Secondary malignant neoplasm of brain: Secondary | ICD-10-CM | POA: Diagnosis not present

## 2016-01-09 DIAGNOSIS — Z5189 Encounter for other specified aftercare: Secondary | ICD-10-CM | POA: Diagnosis not present

## 2016-01-09 DIAGNOSIS — R488 Other symbolic dysfunctions: Secondary | ICD-10-CM | POA: Diagnosis not present

## 2016-01-09 DIAGNOSIS — C719 Malignant neoplasm of brain, unspecified: Secondary | ICD-10-CM | POA: Diagnosis not present

## 2016-01-09 DIAGNOSIS — M6281 Muscle weakness (generalized): Secondary | ICD-10-CM | POA: Diagnosis not present

## 2016-01-09 DIAGNOSIS — R748 Abnormal levels of other serum enzymes: Secondary | ICD-10-CM | POA: Diagnosis not present

## 2016-01-09 DIAGNOSIS — K922 Gastrointestinal hemorrhage, unspecified: Secondary | ICD-10-CM | POA: Diagnosis not present

## 2016-01-09 DIAGNOSIS — C3490 Malignant neoplasm of unspecified part of unspecified bronchus or lung: Secondary | ICD-10-CM | POA: Diagnosis not present

## 2016-01-09 DIAGNOSIS — I10 Essential (primary) hypertension: Secondary | ICD-10-CM | POA: Diagnosis not present

## 2016-01-09 DIAGNOSIS — Z9889 Other specified postprocedural states: Secondary | ICD-10-CM | POA: Diagnosis not present

## 2016-01-09 DIAGNOSIS — Z66 Do not resuscitate: Secondary | ICD-10-CM | POA: Diagnosis not present

## 2016-01-09 DIAGNOSIS — Z7982 Long term (current) use of aspirin: Secondary | ICD-10-CM | POA: Diagnosis not present

## 2016-01-09 DIAGNOSIS — E784 Other hyperlipidemia: Secondary | ICD-10-CM | POA: Diagnosis not present

## 2016-01-09 DIAGNOSIS — Z510 Encounter for antineoplastic radiation therapy: Secondary | ICD-10-CM | POA: Diagnosis present

## 2016-01-09 DIAGNOSIS — N179 Acute kidney failure, unspecified: Secondary | ICD-10-CM | POA: Diagnosis not present

## 2016-01-09 DIAGNOSIS — R109 Unspecified abdominal pain: Secondary | ICD-10-CM | POA: Diagnosis not present

## 2016-01-09 DIAGNOSIS — F17210 Nicotine dependence, cigarettes, uncomplicated: Secondary | ICD-10-CM | POA: Diagnosis present

## 2016-01-09 DIAGNOSIS — G894 Chronic pain syndrome: Secondary | ICD-10-CM | POA: Diagnosis not present

## 2016-01-09 DIAGNOSIS — K210 Gastro-esophageal reflux disease with esophagitis: Secondary | ICD-10-CM | POA: Diagnosis not present

## 2016-01-09 DIAGNOSIS — R262 Difficulty in walking, not elsewhere classified: Secondary | ICD-10-CM | POA: Diagnosis not present

## 2016-01-09 DIAGNOSIS — Z79899 Other long term (current) drug therapy: Secondary | ICD-10-CM | POA: Diagnosis not present

## 2016-01-09 DIAGNOSIS — R918 Other nonspecific abnormal finding of lung field: Secondary | ICD-10-CM | POA: Diagnosis not present

## 2016-01-09 LAB — GLUCOSE, CAPILLARY
GLUCOSE-CAPILLARY: 128 mg/dL — AB (ref 65–99)
GLUCOSE-CAPILLARY: 100 mg/dL — AB (ref 65–99)
GLUCOSE-CAPILLARY: 103 mg/dL — AB (ref 65–99)
Glucose-Capillary: 132 mg/dL — ABNORMAL HIGH (ref 65–99)

## 2016-01-09 MED ORDER — HYDROCODONE-ACETAMINOPHEN 5-325 MG PO TABS: 1.0000 | ORAL_TABLET | ORAL | 0 refills | Status: AC | PRN

## 2016-01-09 MED ORDER — QUETIAPINE FUMARATE 50 MG PO TABS: 50.0000 mg | ORAL_TABLET | Freq: Every day | ORAL | 0 refills | Status: AC

## 2016-01-09 MED ORDER — QUETIAPINE FUMARATE 50 MG PO TABS: 50.0000 mg | ORAL_TABLET | Freq: Every day | ORAL | Status: DC

## 2016-01-09 MED ORDER — DEXAMETHASONE 4 MG PO TABS: 4.0000 mg | ORAL_TABLET | Freq: Three times a day (TID) | ORAL | 0 refills | Status: AC

## 2016-01-09 MED ORDER — DEXAMETHASONE 4 MG PO TABS
4.0000 mg | ORAL_TABLET | Freq: Three times a day (TID) | ORAL | Status: DC
  Administered 2016-01-09: 4 mg via ORAL
  Filled 2016-01-09: qty 1

## 2016-01-09 NOTE — Discharge Summary (Signed)
Physician Discharge Summary  Alexis Buck LHT:[id number] DOB: 03/15/1941 DOA: 01/01/2016  PCP: Lavera Guise, MD  Admit date: 01/01/2016 Discharge date: 01/09/2016  Admitted From: Home Disposition: SNF  Recommendations for Outpatient Follow-up:  1. Follow up with PCP in 1-2 weeks   Discharge Condition: Stable CODE STATUS: DNR Diet recommendation: Regular   Brief/Interim Summary: 75 y.o.femalewith medical history significant for tobacco abuse, hypertension, and GERD who presentedfor evaluation of altered mental status. Patient had reportedly been complaining of difficulty focusing and vision changes going back approximately 6 weeks. Also over the past 6 weeks, her daughter is noted episodes in which the patient appears to "stare off." Patient was found to be sitting on the floor after an apparent fall on day of admission. She was more confused than she had been, asking nonsensical questions, and was taken to the [facility] emergency department for evaluation. In the ED chest x-ray featured a conspicuous mass in the left lung. CT of the head and chest were obtained, revealing vasogenic edema surrounding a brain mass in the right parieto-occipital region, suspected secondary to lung primary, and with interval increase in the ventricular system suggestive of developing hydrocephalus. Neurosurgery was consulted by the ED physician and advised a medical admission to [facility]. Patient was then transferred to [facility] for evaluation by neurosurgery. MRI was attempted to assist in formulating treatment plan for Neurosurgery but was unable to be successfully completed secondary to patient's anxiety. MRI under sedation  done on 8/15--please see results below.  After neurosurgical and pulmonary recommendations were made, the patient's daughter and son requested palliative medicine consult for goals of care. 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 -appreciateneurosurgery followup -appreciate pulmonary consult -long discussion with daughter--she does not want any invasive procedures to be done and is favoring an approach more directed toward comfort -palliative medicine consult appreciated-->SNF for rehab-->then home or ALF with hospice--goal be full comfort measures with hospice support.  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) - SCr 1.83 on admission with no prior available for comparison  - there is an acute component given recent poor oral intake and s/s of dehydration  - Patient having good PO intake -serum creatinine improved -suspect baseline creatinine 0.9-1.2  Hypokalemia and Hypomagnesemia  - Serum potassium 2.7 on admission  -  Likely secondary to HCTZ, will hold  -  Magnesium level low; 2g IV given at start of admission  - repleted  Hypercalcemia  - Serum calcium 11.7 on admission but decreased to 9.6 - Suspected secondary to malignant process  - Continue rehydration with IVF   Leukocytosis  - WBC 24,500 on admission with neutrophilic predominance  - Suspect this is secondary to the malignant process rather than infection  - Blood and urine cultures--neg - Watching off of abx for now-->remains stable  Hypertension - Managed with Coreg and HCTZ at home  - Hold HCTZ 2/2 hypokalemia - continue Coreg with monitoring  - blood pressures reasonablycontrolled    Discharge Instructions  Discharge Instructions    Diet - low sodium heart healthy    Complete by:  As directed   Increase activity slowly    Complete by:  As directed   Increase activity slowly    Complete by:  As directed       Medication List    STOP taking these medications   hydrochlorothiazide 12.5 MG tablet Commonly known as:  HYDRODIURIL     TAKE these medications   aspirin EC 81 MG tablet Take 81 mg by mouth daily.   busPIRone 7.5 MG tablet Commonly known as:  BUSPAR Take 7.5 mg by mouth 2 (two) times daily.   carvedilol 12.5 MG tablet Commonly known as:  COREG Take 12.5 mg by mouth 2 (two) times daily.   CENTRUM SILVER 50+WOMEN Tabs Take by mouth.   citalopram 40 MG tablet Commonly known as:  CELEXA Take 40 mg by mouth daily.   dexamethasone 4 MG tablet Commonly known as:  DECADRON Take 1 tablet (4 mg total) by mouth every 8 (eight) hours.   HYDROcodone-acetaminophen 5-325 MG tablet Commonly known as:  NORCO/VICODIN Take 1-2 tablets by mouth every 4 (four) hours as needed for moderate pain.   omeprazole 20 MG capsule Commonly known as:  PRILOSEC Take 20 mg by mouth daily.   QUEtiapine 50 MG tablet Commonly known as:  SEROQUEL Take 1 tablet (50 mg total) by mouth at bedtime.   simvastatin 20 MG  tablet Commonly known as:  ZOCOR Take 20 mg by mouth every evening.       No Known Allergies  Consultations:  Neurosurgery  Pulmonology  Radiation Onc  Palliative medicine   Procedures/Studies: Ct Head Wo Contrast  Result Date: 01/01/2016 CLINICAL DATA:  75 year old female with a history of altered mental status EXAM: CT HEAD WITHOUT CONTRAST TECHNIQUE: Contiguous axial images were obtained from the base of the skull through the vertex without intravenous contrast. COMPARISON:  08/17/2012, CT of the same date FINDINGS: Extensive confluent hypodensity in the right subcortical white matter extending to the ventricular margin. Gray-white differentiation maintained. There is a heterogeneously dense mass in the right parieto-occipital region, extending to the inner table of the calvarium, measuring 3.4 cm by 2.9 cm. Local mass effect, with distortion of the posterior horn of the right lateral ventricle. The ventricles are enlarged compared to the prior CT. No intracranial hemorrhage. Expanded appearance of the posterior right corpus callosum, which is hypodense. Unremarkable appearance of the calvarium without acute fracture or aggressive lesion. Unremarkable appearance of the scalp soft tissues. Unremarkable appearance of the bilateral orbits. Mastoid air cells are clear. No significant paranasal sinus disease. IMPRESSION: Evidence of vasogenic edema of the right cerebral white matter secondary to mass (3.5 cm x 2.9 cm) in the right parieto-occipital region. Given the appearance of the chest CT performed on today's date, this most likely represents metastatic  disease. Further evaluation with contrast-enhanced MR is indicated. Expanded appearance of the posterior right corpus callosum, potentially edema and/or tumor. Increasing size of the ventricular system compare to the prior CT, potentially representing developing hydrocephalus. These results were called by telephone at the time of  interpretation on 01/01/2016 at 12:39 pm to Dr. Lavonia Drafts , who verbally acknowledged these results. Signed, Dulcy Fanny. Earleen Newport, DO [facility] Radiology Electronically Signed   By: Corrie Mckusick D.O.   On: 01/01/2016 12:40   Ct Chest W Contrast  Result Date: 01/01/2016 CLINICAL DATA:  Recent altered mental status with mass on recent chest x-ray EXAM: CT CHEST WITH CONTRAST TECHNIQUE: Multidetector CT imaging of the chest was performed during intravenous contrast administration. CONTRAST:  12m ISOVUE-300 IOPAMIDOL (ISOVUE-300) INJECTION 61% COMPARISON:  Plain film from earlier in the same day FINDINGS: Cardiovascular: Thoracic aorta demonstrates some calcific changes without evidence of aneurysm or dissection. The pulmonary artery as visualized is within normal limits. Coronary calcifications are noted. The cardiac structures are otherwise within normal limits. Mediastinum/Nodes: The thoracic inlet demonstrates bilateral calcified thyroid nodules. The largest of these lies on the right measuring approximately 2.5 cm in greatest dimension. These are stable from a prior exam from 2014. Fluid level is noted within the esophagus which may be related to some distal narrowing. No significant hilar or mediastinal adenopathy is noted. Lungs/Pleura: Mild dependent atelectasis is noted in the lower lobes. Small bilateral pleural effusions are seen. In the left lower lobe abutting the major fissure, there is a 6.0 by 3.7 by 5.5 cm soft tissue mass lesion. On the sagittal reconstructions, the lesion appears to extend across the minor fissure into the upper lobe. It envelops branches of the left lower lobe pulmonary artery. There is also impingement upon the lower lobe bronchial tree. These changes are consistent with primary pulmonary neoplasm. Upper Abdomen: Within normal limits. Musculoskeletal: Degenerative changes of the thoracic spine are noted. IMPRESSION: Left lung  mass as described above consistent with primary pulmonary neoplasm. Electronically Signed   By: MInez CatalinaM.D.   On: 01/01/2016 12:39   Mr BJeri CosWGBContrast  Result Date: 01/05/2016 CLINICAL DATA:  Acute presentation with altered mental status 4 days ago. Abnormal chest radiograph. Brain mass. EXAM: MRI HEAD WITHOUT AND WITH CONTRAST TECHNIQUE: Multiplanar, multiecho pulse sequences of the brain and surrounding structures were obtained without and with intravenous contrast. CONTRAST:  14mMULTIHANCE GADOBENATE DIMEGLUMINE 529 MG/ML IV SOLN COMPARISON:  CT 01/01/2016 FINDINGS: In the right occipital lobe, there is a 4 cm mass with internal necrosis measuring up to 4 cm in diameter. There is associated vasogenic edema within the right temporal, occipital and parietal regions. There is mass effect with flattening of the occipital horn of the right lateral ventricle. There is right-to-left shift of 3 mm. Enhancement extending anterior from the main tumor could possibly be a minimal enhancement in the collapsed occipital horn. No other a pen the mall enhancement is seen. No second mass lesion is evident. The brain otherwise shows chronic small-vessel ischemic changes of the cerebral hemispheric white matter. No skull or skullbase lesion is seen. No hydrocephalus. No extra-axial fluid collection. No pituitary mass. No inflammatory sinus disease. IMPRESSION: 4 cm mass in the right occipital lobe with internal necrosis. Vasogenic edema affecting the right hemisphere. Right-to-left shift of 3 mm. In the setting of what looks like a primary lung cancer, metastatic disease is the most likely diagnosis. Lesion could represent a primary brain  malignancy, but that is less likely. Electronically Signed   By: Nelson Chimes M.D.   On: 01/05/2016 12:58   Dg Chest Port 1 View  Result Date: 01/01/2016 CLINICAL DATA:  Altered mental status, recent altered mental status EXAM: PORTABLE CHEST 1 VIEW COMPARISON:  02/10/2005  FINDINGS: Cardiac shadow is mildly enlarged. There is 7 cm mass lesions superimposed over the left hilar region new from the prior exam. No bony abnormality is seen. IMPRESSION: Changes consistent with a large left lung mass. CT of the chest is recommended for further evaluation. Electronically Signed   By: Inez Catalina M.D.   On: 01/01/2016 11:08         Discharge Exam: Vitals:   01/08/16 2051 01/09/16 0500  BP: 128/66 (!) 161/95  Pulse: 79 86  Resp:    Temp: 98.1 F (36.7 C) 98 F (36.7 C)   Vitals:   01/08/16 1336 01/08/16 1338 01/08/16 2051 01/09/16 0500  BP: (!) 143/104 (!) 164/79 128/66 (!) 161/95  Pulse: 75 74 79 86  Resp: 16     Temp: 98.2 F (36.8 C)  98.1 F (36.7 C) 98 F (36.7 C)  TempSrc: Oral  Oral Oral  SpO2: 100%  92% 91%  Weight:    72.3 kg (159 lb 6.3 oz)  Height:        General: Pt is alert, awake, not in acute distress Cardiovascular: RRR, S1/S2 +, no rubs, no gallops Respiratory: fine bibasilar rales, no wheeze Abdominal: Soft, NT, ND, bowel sounds + Extremities: no edema, no cyanosis   The results of significant diagnostics from this hospitalization (including imaging, microbiology, ancillary and laboratory) are listed below for reference.    Significant Diagnostic Studies: Ct Head Wo Contrast  Result Date: 01/01/2016 CLINICAL DATA:  75 year old female with a history of altered mental status EXAM: CT HEAD WITHOUT CONTRAST TECHNIQUE: Contiguous axial images were obtained from the base of the skull through the vertex without intravenous contrast. COMPARISON:  08/17/2012, CT of the same date FINDINGS: Extensive confluent hypodensity in the right subcortical white matter extending to the ventricular margin. Gray-white differentiation maintained. There is a heterogeneously dense mass in the right parieto-occipital region, extending to the inner table of the calvarium, measuring 3.4 cm by 2.9 cm. Local mass effect, with distortion of the posterior horn of  the right lateral ventricle. The ventricles are enlarged compared to the prior CT. No intracranial hemorrhage. Expanded appearance of the posterior right corpus callosum, which is hypodense. Unremarkable appearance of the calvarium without acute fracture or aggressive lesion. Unremarkable appearance of the scalp soft tissues. Unremarkable appearance of the bilateral orbits. Mastoid air cells are clear. No significant paranasal sinus disease. IMPRESSION: Evidence of vasogenic edema of the right cerebral white matter secondary to mass (3.5 cm x 2.9 cm) in the right parieto-occipital region. Given the appearance of the chest CT performed on today's date, this most likely represents metastatic disease. Further evaluation with contrast-enhanced MR is indicated. Expanded appearance of the posterior right corpus callosum, potentially edema and/or tumor. Increasing size of the ventricular system compare to the prior CT, potentially representing developing hydrocephalus. These results were called by telephone at the time of interpretation on 01/01/2016 at 12:39 pm to Dr. Lavonia Drafts , who verbally acknowledged these results. Signed, Dulcy Fanny. Earleen Newport, DO [facility] Radiology Electronically Signed   By: Corrie Mckusick D.O.   On: 01/01/2016 12:40   Ct Chest W Contrast  Result Date: 01/01/2016 CLINICAL DATA:  Recent  altered mental status with mass on recent chest x-ray EXAM: CT CHEST WITH CONTRAST TECHNIQUE: Multidetector CT imaging of the chest was performed during intravenous contrast administration. CONTRAST:  50m ISOVUE-300 IOPAMIDOL (ISOVUE-300) INJECTION 61% COMPARISON:  Plain film from earlier in the same day FINDINGS: Cardiovascular: Thoracic aorta demonstrates some calcific changes without evidence of aneurysm or dissection. The pulmonary artery as visualized is within normal limits. Coronary calcifications are noted. The cardiac structures are otherwise within  normal limits. Mediastinum/Nodes: The thoracic inlet demonstrates bilateral calcified thyroid nodules. The largest of these lies on the right measuring approximately 2.5 cm in greatest dimension. These are stable from a prior exam from 2014. Fluid level is noted within the esophagus which may be related to some distal narrowing. No significant hilar or mediastinal adenopathy is noted. Lungs/Pleura: Mild dependent atelectasis is noted in the lower lobes. Small bilateral pleural effusions are seen. In the left lower lobe abutting the major fissure, there is a 6.0 by 3.7 by 5.5 cm soft tissue mass lesion. On the sagittal reconstructions, the lesion appears to extend across the minor fissure into the upper lobe. It envelops branches of the left lower lobe pulmonary artery. There is also impingement upon the lower lobe bronchial tree. These changes are consistent with primary pulmonary neoplasm. Upper Abdomen: Within normal limits. Musculoskeletal: Degenerative changes of the thoracic spine are noted. 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 Electronically Signed   By: MInez CatalinaM.D.   On: 01/01/2016 12:39   Mr BJeri CosWPFContrast  Result Date: 01/05/2016 CLINICAL DATA:  Acute presentation with altered mental status 4 days ago. Abnormal chest radiograph. Brain mass. EXAM: MRI HEAD WITHOUT AND WITH CONTRAST TECHNIQUE: Multiplanar, multiecho pulse sequences of the brain and surrounding structures were obtained without and with intravenous contrast. CONTRAST:  129mMULTIHANCE GADOBENATE DIMEGLUMINE 529 MG/ML IV SOLN COMPARISON:  CT 01/01/2016 FINDINGS: In the right occipital lobe, there is a 4 cm mass with internal necrosis measuring up to 4 cm in diameter. There is associated vasogenic edema within the right temporal, occipital and parietal regions. There is mass effect with flattening of the occipital horn of the right lateral ventricle. There is right-to-left shift  of 3 mm. Enhancement extending anterior from the main tumor could possibly be a minimal enhancement in the collapsed occipital horn. No other a pen the mall enhancement is seen. No second mass lesion is evident. The brain otherwise shows chronic small-vessel ischemic changes of the cerebral hemispheric white matter. No skull or skullbase lesion is seen. No hydrocephalus. No extra-axial fluid collection. No pituitary mass. No inflammatory sinus disease. IMPRESSION: 4 cm mass in the right occipital lobe with internal necrosis. Vasogenic edema affecting the right hemisphere. Right-to-left shift of 3 mm. In the setting of what looks like a primary lung cancer, metastatic disease is the most likely diagnosis. Lesion could represent a primary brain malignancy, but that is less likely. Electronically Signed   By: MaNelson Chimes.D.   On: 01/05/2016 12:58   Dg Chest Port 1 View  Result Date: 01/01/2016 CLINICAL DATA:  Altered mental status, recent altered mental status EXAM: PORTABLE CHEST 1 VIEW COMPARISON:  02/10/2005 FINDINGS: Cardiac shadow is mildly enlarged. There is 7 cm mass lesions superimposed over the left hilar region new from the prior exam. No bony abnormality is seen. IMPRESSION: Changes consistent with a large left lung mass. CT of the chest is recommended for further evaluation. Electronically Signed   By: MaElta Guadeloupe  Lukens M.D.   On: 01/01/2016 11:08     Microbiology: Recent Results (from the past 240 hour(s))  Blood Culture (routine x 2)     Status: None   Collection Time: 01/01/16 10:47 AM  Result Value Ref Range Status   Specimen Description BLOOD RIGHT ANTECUBITAL  Final   Special Requests BOTTLES DRAWN AEROBIC AND ANAEROBIC  [place]  Final   Culture NO GROWTH 5 DAYS  Final   Report Status 01/06/2016 FINAL  Final  Blood Culture (routine x 2)     Status: None   Collection Time: 01/01/16 10:47 AM  Result Value Ref Range Status   Specimen Description BLOOD LEFT HAND  Final   Special Requests  BOTTLES DRAWN AEROBIC AND ANAEROBIC  3CC  Final   Culture NO GROWTH 5 DAYS  Final   Report Status 01/06/2016 FINAL  Final  Urine culture     Status: None   Collection Time: 01/01/16 10:47 AM  Result Value Ref Range Status   Specimen Description URINE, RANDOM  Final   Special Requests NONE  Final   Culture NO GROWTH Performed at [facility]   Final   Report Status 01/02/2016 FINAL  Final  MRSA PCR Screening     Status: None   Collection Time: 01/01/16  8:14 PM  Result Value Ref Range Status   MRSA by PCR NEGATIVE NEGATIVE Final    Comment:        The GeneXpert MRSA Assay (FDA approved for NASAL specimens only), is one component of a comprehensive MRSA colonization surveillance program. It is not intended to diagnose MRSA infection nor to guide or monitor treatment for MRSA infections.      Labs: Basic Metabolic Panel:  Recent Labs Lab 01/03/16 1451 01/05/16 1722 01/07/16 0319 01/08/16 0315  NA 133* 136 136 137  K 3.3* 3.9 3.5 3.6  CL 97* 98* 99* 100*  CO2 '27 29 28 27  '$ GLUCOSE 103* 100* 106* 103*  BUN 29* 21* 17 16  CREATININE 1.04* 0.91 0.86 0.93  CALCIUM 9.6 9.1 8.4* 8.4*  MG  --   --   --  1.7   Liver Function Tests:  Recent Labs Lab 01/05/16 1722  AST 27  ALT 30  ALKPHOS 48  BILITOT 0.6  PROT 5.5*  ALBUMIN 2.4*   No results for input(s): LIPASE, AMYLASE in the last 168 hours. No results for input(s): AMMONIA in the last 168 hours. CBC:  Recent Labs Lab 01/05/16 1824  WBC 15.3*  NEUTROABS 14.4*  HGB 15.0  HCT 43.6  MCV 90.3  PLT 317   Cardiac Enzymes: No results for input(s): CKTOTAL, CKMB, CKMBINDEX, TROPONINI in the last 168 hours. BNP: Invalid input(s): POCBNP CBG:  Recent Labs Lab 01/08/16 1619 01/08/16 2056 01/09/16 0105 01/09/16 0507 01/09/16 0850  GLUCAP 122* 142* 128* 100* 132*    Time coordinating discharge:  Greater than 30 minutes  Signed:  Collins Kerby, DO [facility] Pager: [phone] 01/09/2016,  11:05 AM

## 2016-01-09 NOTE — Progress Notes (Signed)
Initial Nutrition Assessment  DOCUMENTATION CODES:   Obesity unspecified  INTERVENTION:  Recommend nutritional supplementation post discharge to aid in adequate caloric and protein needs.   NUTRITION DIAGNOSIS:   Increased nutrient needs related to catabolic illness as evidenced by estimated needs.  GOAL:   Patient will meet greater than or equal to 90% of their needs  MONITOR:   PO intake, Labs, Weight trends, Skin, I & O's  REASON FOR ASSESSMENT:   Malnutrition Screening Tool    ASSESSMENT:   75 y.o. female  with past medical history of Tobacco, hypertension, and GERD admitted on 01/01/2016 with altered mental status and visual changes.  She has subsequently been discovered to have lung mass as well as parieto-occipital mass with features consistent with metastatic lung cancer.  After discussion with palliative medicine, it was decided not to pursue any invasive procedures.  Plans for discharge to SNF for rehabilitation with ultimate goal to transition back home with hospice and for comfort. Meal completion has been 50-90%. Pt reports appetite has been fine currently and PTA. Pt reports not not consuming scheduled set meals, however "nibbles" on food throughout the whole day. Family at bedside reports pt is eating well at home. Pt with no significant weight loss per weight records. Recommend nutritional supplementation post discharge to aid inadequate nutrition needs to help pt go home well enough.    Nutrition-Focused physical exam completed. Findings are no fat depletion, moderate muscle depletion, and no edema.   Labs and medications reviewed.   Diet Order:  Diet regular Room service appropriate? Yes; Fluid consistency: Thin Diet - low sodium heart healthy  Skin:  Reviewed, no issues  Last BM:  8/17  Height:   Ht Readings from Last 1 Encounters:  01/01/16 '4\' 11"'$  (1.499 m)    Weight:   Wt Readings from Last 1 Encounters:  01/09/16 159 lb 6.3 oz (72.3 kg)     Ideal Body Weight:  44.5 kg  BMI:  Body mass index is 32.19 kg/m.  Estimated Nutritional Needs:   Kcal:  1750-1900  Protein:  80-90 grams  Fluid:  1.7 - 1.9 L/day  EDUCATION NEEDS:   Education needs addressed  Corrin Parker, MS, RD, LDN Pager # [phone] After hours/ weekend pager # [phone]

## 2016-01-09 NOTE — Progress Notes (Signed)
Hand off report gave to [place] at [facility]. Pt is resting in bed comfortble and ready to discharge.

## 2016-01-13 DIAGNOSIS — I10 Essential (primary) hypertension: Secondary | ICD-10-CM | POA: Diagnosis not present

## 2016-01-13 DIAGNOSIS — E784 Other hyperlipidemia: Secondary | ICD-10-CM | POA: Diagnosis not present

## 2016-01-13 DIAGNOSIS — K219 Gastro-esophageal reflux disease without esophagitis: Secondary | ICD-10-CM | POA: Diagnosis not present

## 2016-01-19 DIAGNOSIS — I10 Essential (primary) hypertension: Secondary | ICD-10-CM | POA: Diagnosis not present

## 2016-01-19 DIAGNOSIS — E784 Other hyperlipidemia: Secondary | ICD-10-CM | POA: Diagnosis not present

## 2016-01-19 DIAGNOSIS — K219 Gastro-esophageal reflux disease without esophagitis: Secondary | ICD-10-CM | POA: Diagnosis not present

## 2016-01-27 ENCOUNTER — Ambulatory Visit
Admission: RE | Admit: 2016-01-27 | Discharge: 2016-01-27 | Disposition: A | Discharge status: Home / Self Care | Payer: Medicare Other | Source: Ambulatory Visit | Attending: Radiation Oncology | Admitting: Radiation Oncology

## 2016-01-27 ENCOUNTER — Encounter: Payer: Self-pay | Admitting: Radiation Oncology

## 2016-01-27 VITALS — BP 147/100 | HR 87 | Temp 98.1°F | Resp 20 | Wt 160.4 lb

## 2016-01-27 DIAGNOSIS — Z510 Encounter for antineoplastic radiation therapy: Secondary | ICD-10-CM | POA: Insufficient documentation

## 2016-01-27 DIAGNOSIS — R918 Other nonspecific abnormal finding of lung field: Secondary | ICD-10-CM | POA: Diagnosis not present

## 2016-01-27 DIAGNOSIS — C7931 Secondary malignant neoplasm of brain: Secondary | ICD-10-CM | POA: Diagnosis not present

## 2016-01-27 HISTORY — DX: Malignant (primary) neoplasm, unspecified: C80.1

## 2016-01-27 NOTE — Consult Note (Signed)
Except an outstanding is perfect of Radiation Oncology NEW PATIENT EVALUATION  Name: Alexis Buck  MRN: [id number]  Date:   01/27/2016     DOB: 10/18/1940   This 75 y.o. 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 Cancer    Pt is here for initial consultation for brain metastasis.     DIAGNOSIS: The encounter diagnosis was Brain metastasis ([place]).   PREVIOUS INVESTIGATIONS:  CT scan of the chest and MRI of the brain reviewed Clinical notes reviewed  HPI: Patient is a 75 year old female who presented with altered mental status and visual field changes. She was found to have a lung mass consistent with primary bronchogenic lung cancer measuring 6 x 5.5 cm. She also an MRI scan and CT scan of the head was noted to have a 4 cm right occipital mass with necrosis and vasogenic edema consistent with a solitary brain metastasis. She has been started on steroid therapy. Family declined bronchoscopy for tissue diagnosis. Patient is been evaluated by physical medicine medicine and rehabilitation as well as palliative care. She is seen today at the family's request for consideration of palliative radiation therapy to her brain. Patient is a poor historian but does complain of some decrease in visual fields and headaches.  PLANNED TREATMENT REGIMEN: Partial brain radiation  PAST MEDICAL HISTORY:  has a past medical history of Benign neoplasm of breast (2009); Cancer ([place]); and Hypertension.    PAST SURGICAL HISTORY:  Past Surgical History:  Procedure Laterality Date  . BREAST BIOPSY Left 2009  . CESAREAN SECTION    . COLONOSCOPY  2006   Dr.KHAN  . POLYPECTOMY  2006  . RADIOLOGY WITH ANESTHESIA N/A 01/05/2016   Procedure: MRI BRAIN WITH AND WITHOUT;  Surgeon: Medication Radiologist, MD;  Location: [place];   Service: Radiology;  Laterality: N/A;    FAMILY HISTORY: family history is not on file.  SOCIAL HISTORY:  reports that she has been smoking.  She has a 120.00 pack-year smoking history. She does not have any smokeless tobacco history on file. She reports that she drinks alcohol. She reports that she does not use drugs.  ALLERGIES: Review of patient's allergies indicates no known allergies.  MEDICATIONS:  Current Outpatient Prescriptions  Medication Sig Dispense Refill  . alendronate (FOSAMAX) 70 MG tablet     . ALPRAZolam (XANAX) 0.25 MG tablet     . aspirin EC 81 MG tablet Take 81 mg by mouth daily.    . busPIRone (BUSPAR) 7.5 MG tablet Take 7.5 mg by mouth 2 (two) times daily.    Marland Kitchen CARTIA XT 180 MG 24 hr capsule     . carvedilol (COREG) 12.5 MG tablet Take 12.5 mg by mouth 2 (two) times daily.    . citalopram (CELEXA) 40 MG tablet Take 40 mg by mouth daily.    Marland Kitchen dexamethasone (DECADRON) 4 MG tablet Take 1 tablet (4 mg total) by mouth every 8 (eight) hours. 90 tablet 0  . HYDROcodone-acetaminophen (NORCO/VICODIN) 5-325 MG tablet Take 1-2 tablets by mouth every 4 (four) hours as needed for moderate pain. 30 tablet 0  . Multiple Vitamins-Minerals (CENTRUM SILVER 50+WOMEN) TABS Take by mouth.    Marland Kitchen omeprazole (PRILOSEC) 20 MG capsule Take 20 mg by mouth daily.    . QUEtiapine (SEROQUEL) 50 MG tablet Take 1 tablet (  50 mg total) by mouth at bedtime. 30 tablet 0  . simvastatin (ZOCOR) 20 MG tablet Take 20 mg by mouth every evening.     No current facility-administered medications for this encounter.     ECOG PERFORMANCE STATUS:  2 - Symptomatic, <50% confined to bed  REVIEW OF SYSTEMS: Patient is a poor historian although review of systems was obtained from family members.  Patient denies any weight loss, fatigue, weakness, fever, chills or night sweats. Patient denies any loss of vision, blurred vision. Patient denies any ringing  of the ears or hearing loss. No irregular heartbeat.  Patient denies heart murmur or history of fainting. Patient denies any chest pain or pain radiating to her upper extremities. Patient denies any shortness of breath, difficulty breathing at night, cough or hemoptysis. Patient denies any swelling in the lower legs. Patient denies any nausea vomiting, vomiting of blood, or coffee ground material in the vomitus. Patient denies any stomach pain. Patient states has had normal bowel movements no significant constipation or diarrhea. Patient denies any dysuria, hematuria or significant nocturia. Patient denies any problems walking, swelling in the joints or loss of balance. Patient denies any skin changes, loss of hair or loss of weight. Patient denies any excessive worrying or anxiety or significant depression. Patient denies any problems with insomnia. Patient denies excessive thirst, polyuria, polydipsia. Patient denies any swollen glands, patient denies easy bruising or easy bleeding. Patient denies any recent infections, allergies or URI. Patient "s visual fields have not changed significantly in recent time.    PHYSICAL EXAM: BP (!) 147/100   Pulse 87   Temp 98.1 F (36.7 C)   Resp 20   Wt 160 lb 6.2 oz (72.7 kg)   LMP  (LMP Unknown)   BMI 32.39 kg/m  Well-developed wheelchair-bound female in NAD crude visual fields are within normal range motor sensory and DTR levels appear equal and symmetric in the upper lower extremities. Proprioception is intact. Well-developed well-nourished patient in NAD. HEENT reveals PERLA, EOMI, discs not visualized.  Oral cavity is clear. No oral mucosal lesions are identified. Neck is clear without evidence of cervical or supraclavicular adenopathy. Lungs are clear to A&P. Cardiac examination is essentially unremarkable with regular rate and rhythm without murmur rub or thrill. Abdomen is benign with no organomegaly or masses noted. Motor sensory and DTR levels are equal and symmetric in the upper and lower extremities.  Cranial nerves II through XII are grossly intact. Proprioception is intact. No peripheral adenopathy or edema is identified. No motor or sensory levels are noted. Crude visual fields are within normal range.  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 I would use hypofractionated course of treatment to 2400 cGy in 4 fractions using I am RT treatment planning and delivery. I would spare normal brain as well as optic nerve and hippocampal region. Risks and benefits of treatment including hair loss fatigue alteration of blood counts possible slight cognitive decline all were discussed in detail with the patient and her family. They all seem to comprehend my treatment plan well. I firstly set up and ordered CT simulation with MRI fusion for next week.  I would like to take this opportunity to thank you for allowing me to participate in the  care of your patient.Armstead Peaks., MD

## 2016-01-30 ENCOUNTER — Encounter: Payer: Self-pay | Admitting: Emergency Medicine

## 2016-01-30 ENCOUNTER — Emergency Department: Payer: Medicare Other

## 2016-01-30 ENCOUNTER — Inpatient Hospital Stay
Admission: EM | Admit: 2016-01-30 | Discharge: 2016-01-31 | DRG: 378 | Disposition: A | Discharge status: Home / Self Care | Payer: Medicare Other | Attending: Internal Medicine | Admitting: Internal Medicine

## 2016-01-30 DIAGNOSIS — K920 Hematemesis: Secondary | ICD-10-CM | POA: Diagnosis not present

## 2016-01-30 DIAGNOSIS — F17210 Nicotine dependence, cigarettes, uncomplicated: Secondary | ICD-10-CM | POA: Diagnosis present

## 2016-01-30 DIAGNOSIS — E785 Hyperlipidemia, unspecified: Secondary | ICD-10-CM | POA: Diagnosis not present

## 2016-01-30 DIAGNOSIS — K219 Gastro-esophageal reflux disease without esophagitis: Secondary | ICD-10-CM | POA: Diagnosis present

## 2016-01-30 DIAGNOSIS — I10 Essential (primary) hypertension: Secondary | ICD-10-CM | POA: Diagnosis not present

## 2016-01-30 DIAGNOSIS — R778 Other specified abnormalities of plasma proteins: Secondary | ICD-10-CM

## 2016-01-30 DIAGNOSIS — Z9889 Other specified postprocedural states: Secondary | ICD-10-CM | POA: Diagnosis not present

## 2016-01-30 DIAGNOSIS — R748 Abnormal levels of other serum enzymes: Secondary | ICD-10-CM | POA: Diagnosis present

## 2016-01-30 DIAGNOSIS — K922 Gastrointestinal hemorrhage, unspecified: Secondary | ICD-10-CM | POA: Diagnosis present

## 2016-01-30 DIAGNOSIS — Z79899 Other long term (current) drug therapy: Secondary | ICD-10-CM

## 2016-01-30 DIAGNOSIS — Z66 Do not resuscitate: Secondary | ICD-10-CM | POA: Diagnosis present

## 2016-01-30 DIAGNOSIS — Z7982 Long term (current) use of aspirin: Secondary | ICD-10-CM | POA: Diagnosis not present

## 2016-01-30 DIAGNOSIS — C3490 Malignant neoplasm of unspecified part of unspecified bronchus or lung: Secondary | ICD-10-CM | POA: Diagnosis not present

## 2016-01-30 DIAGNOSIS — C7931 Secondary malignant neoplasm of brain: Secondary | ICD-10-CM | POA: Diagnosis not present

## 2016-01-30 DIAGNOSIS — R7989 Other specified abnormal findings of blood chemistry: Secondary | ICD-10-CM

## 2016-01-30 DIAGNOSIS — R109 Unspecified abdominal pain: Secondary | ICD-10-CM | POA: Diagnosis not present

## 2016-01-30 LAB — CBC
HCT: 43.2 % (ref 35.0–47.0)
HCT: 40.4 % (ref 35.0–47.0)
HCT: 39.1 % (ref 35.0–47.0)
HCT: 35.2 % (ref 35.0–47.0)
HEMOGLOBIN: 13.5 g/dL (ref 12.0–16.0)
Hemoglobin: 15 g/dL (ref 12.0–16.0)
Hemoglobin: 14 g/dL (ref 12.0–16.0)
Hemoglobin: 12.4 g/dL (ref 12.0–16.0)
MCH: 31.5 pg (ref 26.0–34.0)
MCH: 31.4 pg (ref 26.0–34.0)
MCH: 31.4 pg (ref 26.0–34.0)
MCH: 31.6 pg (ref 26.0–34.0)
MCHC: 34.7 g/dL (ref 32.0–36.0)
MCHC: 34.7 g/dL (ref 32.0–36.0)
MCHC: 34.5 g/dL (ref 32.0–36.0)
MCHC: 35.3 g/dL (ref 32.0–36.0)
MCV: 90.9 fL (ref 80.0–100.0)
MCV: 90.4 fL (ref 80.0–100.0)
MCV: 91.2 fL (ref 80.0–100.0)
MCV: 89.8 fL (ref 80.0–100.0)
PLATELETS: 178 10*3/uL (ref 150–440)
PLATELETS: 167 10*3/uL (ref 150–440)
PLATELETS: 135 10*3/uL — AB (ref 150–440)
Platelets: 156 10*3/uL (ref 150–440)
RBC: 4.75 MIL/uL (ref 3.80–5.20)
RBC: 4.47 MIL/uL (ref 3.80–5.20)
RBC: 4.29 MIL/uL (ref 3.80–5.20)
RBC: 3.92 MIL/uL (ref 3.80–5.20)
RDW: 16.5 % — ABNORMAL HIGH (ref 11.5–14.5)
RDW: 16.4 % — AB (ref 11.5–14.5)
RDW: 16.7 % — ABNORMAL HIGH (ref 11.5–14.5)
RDW: 16.8 % — ABNORMAL HIGH (ref 11.5–14.5)
WBC: 10.1 10*3/uL (ref 3.6–11.0)
WBC: 10.1 10*3/uL (ref 3.6–11.0)
WBC: 9.5 10*3/uL (ref 3.6–11.0)
WBC: 8.5 10*3/uL (ref 3.6–11.0)

## 2016-01-30 LAB — COMPREHENSIVE METABOLIC PANEL
ALBUMIN: 2.4 g/dL — AB (ref 3.5–5.0)
ALK PHOS: 70 U/L (ref 38–126)
ALT: 31 U/L (ref 14–54)
AST: 26 U/L (ref 15–41)
Anion gap: 9 (ref 5–15)
BUN: 32 mg/dL — ABNORMAL HIGH (ref 6–20)
CALCIUM: 8.4 mg/dL — AB (ref 8.9–10.3)
CHLORIDE: 100 mmol/L — AB (ref 101–111)
CO2: 29 mmol/L (ref 22–32)
CREATININE: 0.94 mg/dL (ref 0.44–1.00)
GFR calc Af Amer: >60 mL/min (ref >60)
GFR calc non Af Amer: 57 mL/min — ABNORMAL LOW (ref >60)
GLUCOSE: 164 mg/dL — AB (ref 65–99)
Potassium: 3.8 mmol/L (ref 3.5–5.1)
SODIUM: 138 mmol/L (ref 135–145)
Total Bilirubin: 0.9 mg/dL (ref 0.3–1.2)
Total Protein: 5.4 g/dL — ABNORMAL LOW (ref 6.5–8.1)

## 2016-01-30 LAB — PROTIME-INR
INR: 0.98
PROTHROMBIN TIME: 13 s (ref 11.4–15.2)

## 2016-01-30 LAB — TYPE AND SCREEN
ABO/RH(D): A POS
ANTIBODY SCREEN: NEGATIVE

## 2016-01-30 LAB — TROPONIN I
TROPONIN I: 0.07 ng/mL — AB (ref <0.03)
TROPONIN I: 0.05 ng/mL — AB (ref <0.03)
Troponin I: 0.14 ng/mL (ref <0.03)
Troponin I: 0.06 ng/mL (ref <0.03)

## 2016-01-30 LAB — LACTIC ACID, PLASMA
LACTIC ACID, VENOUS: 2.7 mmol/L — AB (ref 0.5–1.9)
LACTIC ACID, VENOUS: 2.1 mmol/L — AB (ref 0.5–1.9)

## 2016-01-30 MED ORDER — CITALOPRAM HYDROBROMIDE 20 MG PO TABS
40.0000 mg | ORAL_TABLET | Freq: Every day | ORAL | Status: DC
  Administered 2016-01-30 – 2016-01-31 (×2): 40 mg via ORAL
  Filled 2016-01-30 (×2): qty 2

## 2016-01-30 MED ORDER — BUSPIRONE HCL 5 MG PO TABS
7.5000 mg | ORAL_TABLET | Freq: Two times a day (BID) | ORAL | Status: DC
  Administered 2016-01-30 – 2016-01-31 (×3): 7.5 mg via ORAL
  Filled 2016-01-30: qty 2
  Filled 2016-01-30: qty 1.5
  Filled 2016-01-30: qty 2

## 2016-01-30 MED ORDER — SODIUM CHLORIDE 0.9 % IV SOLN
INTRAVENOUS | Status: DC
  Administered 2016-01-30 – 2016-01-31 (×3): via INTRAVENOUS

## 2016-01-30 MED ORDER — RISPERIDONE 1 MG PO TABS
0.5000 mg | ORAL_TABLET | Freq: Every day | ORAL | Status: DC
  Administered 2016-01-30: 0.5 mg via ORAL
  Filled 2016-01-30: qty 0.5

## 2016-01-30 MED ORDER — ONDANSETRON HCL 4 MG/2ML IJ SOLN: 4.0000 mg | Freq: Four times a day (QID) | INTRAMUSCULAR | Status: DC | PRN

## 2016-01-30 MED ORDER — CALCIUM CARBONATE ANTACID 500 MG PO CHEW: 2.0000 | CHEWABLE_TABLET | ORAL | Status: DC | PRN

## 2016-01-30 MED ORDER — ONDANSETRON HCL 4 MG PO TABS: 4.0000 mg | ORAL_TABLET | Freq: Four times a day (QID) | ORAL | Status: DC | PRN

## 2016-01-30 MED ORDER — SODIUM CHLORIDE 0.9 % IV SOLN
80.0000 mg | Freq: Once | INTRAVENOUS | Status: AC
  Administered 2016-01-30: 80 mg via INTRAVENOUS
  Filled 2016-01-30: qty 80

## 2016-01-30 MED ORDER — ADULT MULTIVITAMIN W/MINERALS CH
1.0000 | ORAL_TABLET | Freq: Every day | ORAL | Status: DC
  Administered 2016-01-30 – 2016-01-31 (×2): 1 via ORAL
  Filled 2016-01-30 (×2): qty 1

## 2016-01-30 MED ORDER — FAMOTIDINE IN NACL 20-0.9 MG/50ML-% IV SOLN: 20.0000 mg | Freq: Two times a day (BID) | INTRAVENOUS | Status: DC

## 2016-01-30 MED ORDER — PANTOPRAZOLE SODIUM 40 MG IV SOLR: 40.0000 mg | Freq: Two times a day (BID) | INTRAVENOUS | Status: DC

## 2016-01-30 MED ORDER — OXYCODONE HCL 5 MG PO TABS: 5.0000 mg | ORAL_TABLET | ORAL | Status: DC | PRN

## 2016-01-30 MED ORDER — SIMVASTATIN 20 MG PO TABS
20.0000 mg | ORAL_TABLET | Freq: Every evening | ORAL | Status: DC
  Administered 2016-01-30: 20 mg via ORAL
  Filled 2016-01-30: qty 1

## 2016-01-30 MED ORDER — ACETAMINOPHEN 325 MG PO TABS: 650.0000 mg | ORAL_TABLET | Freq: Four times a day (QID) | ORAL | Status: DC | PRN

## 2016-01-30 MED ORDER — CARVEDILOL 12.5 MG PO TABS
12.5000 mg | ORAL_TABLET | Freq: Two times a day (BID) | ORAL | Status: DC
  Administered 2016-01-30 – 2016-01-31 (×3): 12.5 mg via ORAL
  Filled 2016-01-30 (×3): qty 1

## 2016-01-30 MED ORDER — SODIUM CHLORIDE 0.9 % IV SOLN
8.0000 mg/h | INTRAVENOUS | Status: DC
  Administered 2016-01-30 – 2016-01-31 (×3): 8 mg/h via INTRAVENOUS
  Filled 2016-01-30 (×3): qty 80

## 2016-01-30 MED ORDER — ONDANSETRON HCL 4 MG/2ML IJ SOLN
4.0000 mg | Freq: Once | INTRAMUSCULAR | Status: AC
  Administered 2016-01-30: 4 mg via INTRAVENOUS
  Filled 2016-01-30: qty 2

## 2016-01-30 MED ORDER — SODIUM CHLORIDE 0.9% FLUSH
3.0000 mL | Freq: Two times a day (BID) | INTRAVENOUS | Status: DC
  Administered 2016-01-30 – 2016-01-31 (×2): 3 mL via INTRAVENOUS

## 2016-01-30 MED ORDER — DEXAMETHASONE 4 MG PO TABS
4.0000 mg | ORAL_TABLET | Freq: Three times a day (TID) | ORAL | Status: DC
  Administered 2016-01-30 – 2016-01-31 (×3): 4 mg via ORAL
  Filled 2016-01-30 (×3): qty 1

## 2016-01-30 MED ORDER — ACETAMINOPHEN 650 MG RE SUPP: 650.0000 mg | Freq: Four times a day (QID) | RECTAL | Status: DC | PRN

## 2016-01-30 MED ORDER — QUETIAPINE FUMARATE 25 MG PO TABS
50.0000 mg | ORAL_TABLET | Freq: Two times a day (BID) | ORAL | Status: DC
  Administered 2016-01-30 – 2016-01-31 (×3): 50 mg via ORAL
  Filled 2016-01-30 (×3): qty 2

## 2016-01-30 MED ORDER — SODIUM CHLORIDE 0.9 % IV BOLUS (SEPSIS)
1000.0000 mL | Freq: Once | INTRAVENOUS | Status: AC
  Administered 2016-01-30: 1000 mL via INTRAVENOUS

## 2016-01-30 NOTE — Progress Notes (Signed)
DR Hower was made aware of pt's 2nd lactic acid level,  of 2.1 no new order at this time will continue to monitor

## 2016-01-30 NOTE — ED Provider Notes (Addendum)
[facility] Emergency Department Provider Note  ____________________________________________   I have reviewed the triage vital signs and the nursing notes.   HISTORY  Chief Complaint Hemoptysis    HPI Alexis Buck is a 75 y.o. female who presents today complaining of fleeting occasional epigastric and diffuse abdominal discomfort which comes and goes and is not present at this moment along with vomiting of dark brown/red material 3 since yesterday. She denies any fever or chills she denies any melena or bright red blood per rectum, she denies any constipation. Patient has a history of kidney insufficiency as well as lung cancer recently diagnosed with metastases to the brain for which no acute intervention is planned. Patient also has a history of reflux disease and is scheduled for an outpatient endoscopy on October 3. In addition, she has a history of ulcer disease she states. Nothing makes these pains better and nothing makes them worse they come and go over the last day or so. They seem to be somewhat relieved by vomiting however she states      Past Medical History:  Diagnosis Date  . Benign neoplasm of breast 2009  . Cancer ([place])   . Hypertension     Patient Active Problem List   Diagnosis Date Noted  . Acute renal failure superimposed on stage 3 chronic kidney disease ([place]) 01/06/2016  . Brain tumor ([place])   . Essential hypertension 01/01/2016  . Lung mass 01/01/2016  . Brain metastasis ([place]) 01/01/2016  . Dehydration 01/01/2016  . Altered mental status 01/01/2016  . Kidney disease 01/01/2016  . Hypokalemia 01/01/2016  . Leukocytosis 01/01/2016  . Hypercalcemia 01/01/2016    Past Surgical History:  Procedure Laterality Date  . BREAST BIOPSY Left 2009  . CESAREAN SECTION    . COLONOSCOPY  2006   Dr.KHAN  . POLYPECTOMY  2006  . RADIOLOGY WITH ANESTHESIA N/A 01/05/2016   Procedure: MRI BRAIN WITH AND WITHOUT;  Surgeon: Medication  Radiologist, MD;  Location: [place];  Service: Radiology;  Laterality: N/A;    Prior to Admission medications   Medication Sig Start Date End Date Taking? Authorizing Provider  aspirin EC 81 MG tablet Take 81 mg by mouth daily.   Yes Historical Provider, MD  busPIRone (BUSPAR) 7.5 MG tablet Take 7.5 mg by mouth 2 (two) times daily.   Yes Historical Provider, MD  calcium carbonate (TUMS - DOSED IN MG ELEMENTAL CALCIUM) 500 MG chewable tablet Chew 2 tablets by mouth every 4 (four) hours as needed for indigestion or heartburn.   Yes Historical Provider, MD  carvedilol (COREG) 12.5 MG tablet Take 12.5 mg by mouth 2 (two) times daily.   Yes Historical Provider, MD  citalopram (CELEXA) 40 MG tablet Take 40 mg by mouth daily.   Yes Historical Provider, MD  dexamethasone (DECADRON) 4 MG tablet Take 1 tablet (4 mg total) by mouth every 8 (eight) hours. 01/09/16  Yes Orson Eva, MD  HYDROcodone-acetaminophen (NORCO/VICODIN) 5-325 MG tablet Take 1-2 tablets by mouth every 4 (four) hours as needed for moderate pain. 01/09/16  Yes Orson Eva, MD  Multiple Vitamin (MULTIVITAMIN WITH MINERALS) TABS tablet Take 1 tablet by mouth daily.   Yes Historical Provider, MD  omeprazole (PRILOSEC) 20 MG capsule Take 20 mg by mouth daily.   Yes Historical Provider, MD  QUEtiapine (SEROQUEL) 50 MG tablet Take 1 tablet (50 mg total) by mouth at bedtime. Patient taking differently: Take 50 mg by mouth 2 (two) times daily. 1600 and 2100 01/09/16  Yes Orson Eva, MD  risperiDONE (RISPERDAL) 0.5 MG tablet Take 0.5 mg by mouth at bedtime.   Yes Historical Provider, MD  simvastatin (ZOCOR) 20 MG tablet Take 20 mg by mouth every evening.   Yes Historical Provider, MD    Allergies Review of patient's allergies indicates no known allergies.  Family History  Problem Relation Age of Onset  . Brain cancer Neg Hx     Social History Social History  Substance Use Topics  . Smoking status: Current Every Day Smoker    Packs/day: 2.00     Years: 60.00  . Smokeless tobacco: Never Used  . Alcohol use Yes    Review of Systems Constitutional: No fever/chills Eyes: No visual changes. ENT: No sore throat. No stiff neck no neck pain Cardiovascular: Denies chest pain. Respiratory: Denies shortness of breath. Gastrointestinal:   See history of present illness Genitourinary: Negative for dysuria. Musculoskeletal: Negative lower extremity swelling Skin: Negative for rash. Neurological: Negative for severe headaches, focal weakness or numbness. 10-point ROS otherwise negative.  ____________________________________________   PHYSICAL EXAM:  VITAL SIGNS: ED Triage Vitals  Enc Vitals Group     BP 01/30/16 0630 (!) 156/105     Pulse Rate 01/30/16 0630 (!) 117     Resp 01/30/16 0630 (!) 24     Temp 01/30/16 0632 98 F (36.7 C)     Temp Source 01/30/16 0632 Oral     SpO2 01/30/16 0630 95 %     Weight 01/30/16 0631 160 lb (72.6 kg)     Height 01/30/16 0631 '5\' 3"'$  (1.6 m)     Head Circumference --      Peak Flow --      Pain Score --      Pain Loc --      Pain Edu? --      Excl. in [place]? --     Constitutional: Alert and oriented. Well appearing and in no acute distress. Eyes: Conjunctivae are normal. PERRL. EOMI. Head: Atraumatic. Nose: No congestion/rhinnorhea. Mouth/Throat: Mucous membranes are moist.  Oropharynx non-erythematous. Neck: No stridor.   Nontender with no meningismus Cardiovascular: Normal rate, regular rhythm. Grossly normal heart sounds.  Good peripheral circulation. Respiratory: Normal respiratory effort.  No retractions. Lungs CTAB. Abdominal: Soft and nontender. No distention. No guarding no rebound Back:  There is no focal tenderness or step off.  there is no midline tenderness there are no lesions noted. there is no CVA tenderness Rectal exam: Normal external exam, guaiac negative brown stool female chaperone present Musculoskeletal: No lower extremity tenderness, no upper extremity tenderness. No  joint effusions, no DVT signs strong distal pulses no edema Neurologic:  Normal speech and language. No gross focal neurologic deficits are appreciated.  Skin:  Skin is warm, dry and intact. No rash noted. Psychiatric: Mood and affect are normal. Speech and behavior are normal.  ____________________________________________   LABS (all labs ordered are listed, but only abnormal results are displayed)  Labs Reviewed  COMPREHENSIVE METABOLIC PANEL - Abnormal; Notable for the following:       Result Value   Chloride 100 (*)    Glucose, Bld 164 (*)    BUN 32 (*)    Calcium 8.4 (*)    Total Protein 5.4 (*)    Albumin 2.4 (*)    GFR calc non Af Amer 57 (*)    All other components within normal limits  CBC - Abnormal; Notable for the following:    RDW 16.5 (*)  All other components within normal limits  TROPONIN I - Abnormal; Notable for the following:    Troponin I 0.14 (*)    All other components within normal limits  PROTIME-INR  LACTIC ACID, PLASMA  LACTIC ACID, PLASMA  TYPE AND SCREEN   ____________________________________________  EKG  I personally interpreted any EKGs ordered by me or triage Sinus tachycardia rate 109 bpm no acute ST elevation side possibly oscillated in V3, LAD noted, ____________________________________________  RADIOLOGY  I reviewed any imaging ordered by me or triage that were performed during my shift and, if possible, patient and/or family made aware of any abnormal findings. ____________________________________________   PROCEDURES  Procedure(s) performed: None  Procedures  Critical Care performed: CRITICAL CARE Performed by: Schuyler Amor   Total critical care time: 38 minutes  Critical care time was exclusive of separately billable procedures and treating other patients.  Critical care was necessary to treat or prevent imminent or life-threatening deterioration.  Critical care was time spent personally by me on the following  activities: development of treatment plan with patient and/or surrogate as well as nursing, discussions with consultants, evaluation of patient's response to treatment, examination of patient, obtaining history from patient or surrogate, ordering and performing treatments and interventions, ordering and review of laboratory studies, ordering and review of radiographic studies, pulse oximetry and re-evaluation of patient's condition.   ____________________________________________   INITIAL IMPRESSION / ASSESSMENT AND PLAN / ED COURSE  Pertinent labs & imaging results that were available during my care of the patient were reviewed by me and considered in my medical decision making (see chart for details).  Patient with what is reported to be upper GI bleed. She has been having abdominal pain followed by vomiting which apparently has blood in it. She is quite negative from below with a reassuring hemoglobin however her BUN is elevated and her heart rate is up suggestive of possible GI bleed. We'll start her on antiacids. She's really does not have a surgical abdomen, there is a chance that this is actually hemoptysis but does not seem to quite fit the clinical picture. Nonetheless, patient does have a history of lung cancer. We will obtain an acute abdomen series of a do not have a high suspicion of perforated viscus at this time. Patient mildly tachycardic we'll give IV fluids, type and screen has been ordered, was in the lactic although she does not appear to be septic, and we will continue to assess the patient.   ----------------------------------------- 8:25 AM on 01/30/2016 -----------------------------------------  Patient remains nontoxic in appearance, discussed with the hospitalist, they agree with the management will admit the patient. Blood pressure is stable heart rate is mildly elevated giving IV fluids.  ----------------------------------------- 9:32 AM on  01/30/2016 -----------------------------------------  Hospitalist physician aware of lactic which is a nonspecific finding but obviously of some concern, I do not believe this represents sepsis nonetheless they are aware. X-ray did show some questionable stones, she has no right upper quadrant abdominal tenderness, has no fever, and her complaint is hematemesis, in short there is at this time nothing nothing to suggest cholecystitis or cholangitis but I did make the hospitalist aware of this as she is already been admitted. She is absolutely nontender in this area at this time. Clinical Course   ____________________________________________   FINAL CLINICAL IMPRESSION(S) / ED DIAGNOSES  Final diagnoses:  None      This chart was dictated using voice recognition software.  Despite best efforts to proofread,  errors can occur  which can change meaning.      Schuyler Amor, MD 01/30/16 [id number]    Schuyler Amor, MD 01/30/16 0825    Schuyler Amor, MD 01/30/16 [id number]    Schuyler Amor, MD 01/30/16 [phone]

## 2016-01-30 NOTE — H&P (Signed)
[facility] NAME: Alexis Buck    MR#:  [id number]  DATE OF BIRTH:  12-23-1940   DATE OF ADMISSION:  01/30/2016  PRIMARY CARE PHYSICIAN: Juluis Pitch, MD   REQUESTING/REFERRING PHYSICIAN: McShane  CHIEF COMPLAINT:   Chief Complaint  Patient presents with  . Hemoptysis    HISTORY OF PRESENT ILLNESS:  Alexis Buck  is a 75 y.o. female with a known history of Lung cancer with brain metastases not yet received treatment saw a radiation oncology planning for home for radiation who is presenting with hematemesis. Patient is a poor historian who is presenting to the hospital after complaining of epigastric pain and vomiting blood-dark red. Currently she has no complaints and is unable to provide meaningful information. Currently no epigastric pain, previously 3/10, epigastric, nonradiating no worsening or relieving factors  PAST MEDICAL HISTORY:   Past Medical History:  Diagnosis Date  . Benign neoplasm of breast 2009  . Cancer ([place])   . Hypertension     PAST SURGICAL HISTORY:   Past Surgical History:  Procedure Laterality Date  . BREAST BIOPSY Left 2009  . CESAREAN SECTION    . COLONOSCOPY  2006   Dr.KHAN  . POLYPECTOMY  2006  . RADIOLOGY WITH ANESTHESIA N/A 01/05/2016   Procedure: MRI BRAIN WITH AND WITHOUT;  Surgeon: Medication Radiologist, MD;  Location: [place];  Service: Radiology;  Laterality: N/A;    SOCIAL HISTORY:   Social History  Substance Use Topics  . Smoking status: Current Every Day Smoker    Packs/day: 2.00    Years: 60.00  . Smokeless tobacco: Never Used  . Alcohol use Yes    FAMILY HISTORY:   Family History  Problem Relation Age of Onset  . Brain cancer Neg Hx     DRUG ALLERGIES:  No Known Allergies  REVIEW OF SYSTEMS:  REVIEW OF SYSTEMS:  CONSTITUTIONAL: Denies fevers, chills, fatigue, weakness.  EYES: Denies blurred vision, double vision, or eye pain.  EARS, NOSE, THROAT: Denies  tinnitus, ear pain, hearing loss.  RESPIRATORY: denies cough, shortness of breath, wheezing  CARDIOVASCULAR: Denies chest pain, palpitations, edema.  GASTROINTESTINAL: Denies nausea, vomiting, diarrhea, abdominal pain.  GENITOURINARY: Denies dysuria, hematuria.  ENDOCRINE: Denies nocturia or thyroid problems. HEMATOLOGIC AND LYMPHATIC: Denies easy bruising or bleeding.  SKIN: Denies rash or lesions.  MUSCULOSKELETAL: Denies pain in neck, back, shoulder, knees, hips, or further arthritic symptoms.  NEUROLOGIC: Denies paralysis, paresthesias.  PSYCHIATRIC: Denies anxiety or depressive symptoms. Otherwise full review of systems performed by me is negative.   MEDICATIONS AT HOME:   Prior to Admission medications   Medication Sig Start Date End Date Taking? Authorizing Provider  aspirin EC 81 MG tablet Take 81 mg by mouth daily.   Yes Historical Provider, MD  busPIRone (BUSPAR) 7.5 MG tablet Take 7.5 mg by mouth 2 (two) times daily.   Yes Historical Provider, MD  calcium carbonate (TUMS - DOSED IN MG ELEMENTAL CALCIUM) 500 MG chewable tablet Chew 2 tablets by mouth every 4 (four) hours as needed for indigestion or heartburn.   Yes Historical Provider, MD  carvedilol (COREG) 12.5 MG tablet Take 12.5 mg by mouth 2 (two) times daily.   Yes Historical Provider, MD  citalopram (CELEXA) 40 MG tablet Take 40 mg by mouth daily.   Yes Historical Provider, MD  dexamethasone (DECADRON) 4 MG tablet Take 1 tablet (4 mg total) by mouth every 8 (eight) hours. 01/09/16  Yes Orson Eva, MD  HYDROcodone-acetaminophen (NORCO/VICODIN) 5-325 MG tablet Take 1-2 tablets by mouth every 4 (four) hours as needed for moderate pain. 01/09/16  Yes Orson Eva, MD  Multiple Vitamin (MULTIVITAMIN WITH MINERALS) TABS tablet Take 1 tablet by mouth daily.   Yes Historical Provider, MD  omeprazole (PRILOSEC) 20 MG capsule Take 20 mg by mouth daily.   Yes Historical Provider, MD  QUEtiapine (SEROQUEL) 50 MG tablet Take 1 tablet (50 mg  total) by mouth at bedtime. Patient taking differently: Take 50 mg by mouth 2 (two) times daily. 1600 and 2100 01/09/16  Yes Orson Eva, MD  risperiDONE (RISPERDAL) 0.5 MG tablet Take 0.5 mg by mouth at bedtime.   Yes Historical Provider, MD  simvastatin (ZOCOR) 20 MG tablet Take 20 mg by mouth every evening.   Yes Historical Provider, MD      VITAL SIGNS:  Blood pressure (!) 148/99, pulse (!) 117, temperature 98 F (36.7 C), temperature source Oral, resp. rate (!) 22, height '5\' 3"'$  (1.6 m), weight 72.6 kg (160 lb), SpO2 93 %.  PHYSICAL EXAMINATION:  VITAL SIGNS: Vitals:   01/30/16 0632 01/30/16 0823  BP:  (!) 148/99  Pulse:  (!) 117  Resp:  (!) 22  Temp: 98 F (36.7 C)    GENERAL:74 y.o.female currently in no acute distress.  HEAD: Normocephalic, atraumatic.  EYES: Pupils equal, round, reactive to light. Extraocular muscles intact. No scleral icterus.  MOUTH: Moist mucosal membrane. Dentition intact. No abscess noted.  EAR, NOSE, THROAT: Clear without exudates. No external lesions.  NECK: Supple. No thyromegaly. No nodules. No JVD.  PULMONARY: Clear to ascultation, without wheeze rails or rhonci. No use of accessory muscles, Good respiratory effort. good air entry bilaterally CHEST: Nontender to palpation.  CARDIOVASCULAR: S1 and S2. Tachycardic. No murmurs, rubs, or gallops. No edema. Pedal pulses 2+ bilaterally.  GASTROINTESTINAL: Soft, nontender, nondistended. No masses. Positive bowel sounds. No hepatosplenomegaly.  MUSCULOSKELETAL: No swelling, clubbing, or edema. Range of motion full in all extremities.  NEUROLOGIC: Cranial nerves II through XII are intact. No gross focal neurological deficits. Sensation intact. Reflexes intact.  SKIN: No ulceration, lesions, rashes, or cyanosis. Skin warm and dry. Turgor intact.  PSYCHIATRIC: Mood, affect flat. The patient is awake, alert and oriented x 3. Insight, judgment intact.    LABORATORY PANEL:   CBC  Recent Labs Lab  01/30/16 0638  WBC 10.1  HGB 15.0  HCT 43.2  PLT 178   ------------------------------------------------------------------------------------------------------------------  Chemistries   Recent Labs Lab 01/30/16 0638  NA 138  K 3.8  CL 100*  CO2 29  GLUCOSE 164*  BUN 32*  CREATININE 0.94  CALCIUM 8.4*  AST 26  ALT 31  ALKPHOS 70  BILITOT 0.9   ------------------------------------------------------------------------------------------------------------------  Cardiac Enzymes  Recent Labs Lab 01/30/16 0638  TROPONINI 0.14*   ------------------------------------------------------------------------------------------------------------------  RADIOLOGY:  No results found.  EKG:   Orders placed or performed during the hospital encounter of 01/30/16  . EKG 12-Lead  . EKG 12-Lead  . ED EKG  . ED EKG    IMPRESSION AND PLAN:   75 year old African-American female history of lung cancer with brain metastases presenting after hematemesis. Within the last month she was started on steroids for vasogenic edema  1. Hematemesis: Hemoglobin currently stable but tachycardic, trend CBC every 6 hours, type and screen, GI consult, IV Protonix bolus and infusion 2. Elevated troponin: Telemetry, trend cardiac enzymes would avoid anticoagulation given her 1 3. Hyperlipidemia unspecified: Zocor 4. Lung cancer with brain metastases: Continue Decadron  All the records are reviewed and case discussed with ED provider. Management plans discussed with the patient, family and they are in agreement.  CODE STATUS: DO NOT RESUSCITATE  TOTAL TIME TAKING CARE OF THIS PATIENT: 40 minutes.    Kreig Parson,  Karenann Cai.D on 01/30/2016 at 8:45 AM  Between 7am to 6pm - Pager - [phone]  After 6pm: House Pager: - [phone]  [facility]  Office  [phone]  CC: Primary care physician; Juluis Pitch, MD

## 2016-01-30 NOTE — ED Notes (Signed)
Troponin 0.14. MD notified.

## 2016-01-30 NOTE — Clinical Social Work Note (Signed)
CSW received consult for facility placement. CSW will follow pending any PT or OT recommendations, although neither have been ordered at this time.  Santiago Bumpers, MSW, LCSW-A [phone]

## 2016-01-30 NOTE — ED Notes (Signed)
\  [id number]\

## 2016-01-30 NOTE — ED Notes (Signed)
Lactic 2.7. MD notified

## 2016-01-30 NOTE — ED Triage Notes (Signed)
Pt found in her BR at [facility] with one episode of vomiting bright red blood. Pt is alert at time with EMS VS of BP 127/86 and HR 102.

## 2016-01-31 LAB — CBC
HCT: 38.9 % (ref 35.0–47.0)
Hemoglobin: 13.6 g/dL (ref 12.0–16.0)
MCH: 32.2 pg (ref 26.0–34.0)
MCHC: 35 g/dL (ref 32.0–36.0)
MCV: 92.1 fL (ref 80.0–100.0)
PLATELETS: 142 10*3/uL — AB (ref 150–440)
RBC: 4.22 MIL/uL (ref 3.80–5.20)
RDW: 16.6 % — ABNORMAL HIGH (ref 11.5–14.5)
WBC: 9.1 10*3/uL (ref 3.6–11.0)

## 2016-01-31 MED ORDER — PANTOPRAZOLE SODIUM 40 MG PO TBEC
DELAYED_RELEASE_TABLET | ORAL | 0 refills | Status: AC
Start: 2016-01-31 — End: ?

## 2016-01-31 NOTE — Discharge Summary (Signed)
[facility] NAME: Alexis Buck    MR#:  [id number]  DATE OF BIRTH:  Mar 25, 1941  DATE OF ADMISSION:  01/30/2016 ADMITTING PHYSICIAN: Lytle Butte, MD  DATE OF DISCHARGE: 01/31/16  PRIMARY CARE PHYSICIAN: Juluis Pitch, MD    ADMISSION DIAGNOSIS:  Gastrointestinal hemorrhage, unspecified gastritis, unspecified gastrointestinal hemorrhage type [K92.2]  DISCHARGE DIAGNOSIS:  Active Problems:   Hematemesis   Elevated troponin I level   SECONDARY DIAGNOSIS:   Past Medical History:  Diagnosis Date  . Benign neoplasm of breast 2009  . Cancer ([place])   . Hypertension     HOSPITAL COURSE:  Alexis Buck  is a 75 y.o. female admitted 01/30/2016 with chief complaint hematemsis. Please see H&P performed by Lytle Butte, MD for further information. Patient presented with the above symptoms. She was recently started on high dose decadron for vasogenic edema then developed hematemesis. Started on IV ptotonix, hemoglobin trended and remained stable. Discussed with patient and family GI consult with possible endoscopy, after discussion they wished to hold on this possible intervention as she remained stable and would likely not alter treatment.   DISCHARGE CONDITIONS:   stable  CONSULTS OBTAINED:  Treatment Team:  Lytle Butte, MD Lucilla Lame, MD  DRUG ALLERGIES:  No Known Allergies  DISCHARGE MEDICATIONS:   Current Discharge Medication List    START taking these medications   Details  pantoprazole (PROTONIX) 40 MG tablet '40mg'$  BID for 2 weeks, then '40mg'$  daily Qty: 60 tablet, Refills: 0      CONTINUE these medications which have NOT CHANGED   Details  aspirin EC 81 MG tablet Take 81 mg by mouth daily.    busPIRone (BUSPAR) 7.5 MG tablet Take 7.5 mg by mouth 2 (two) times daily.    calcium carbonate (TUMS - DOSED IN MG ELEMENTAL CALCIUM) 500 MG chewable tablet Chew 2 tablets by mouth every 4 (four) hours as needed for  indigestion or heartburn.    carvedilol (COREG) 12.5 MG tablet Take 12.5 mg by mouth 2 (two) times daily.    citalopram (CELEXA) 40 MG tablet Take 40 mg by mouth daily.    dexamethasone (DECADRON) 4 MG tablet Take 1 tablet (4 mg total) by mouth every 8 (eight) hours. Qty: 90 tablet, Refills: 0    HYDROcodone-acetaminophen (NORCO/VICODIN) 5-325 MG tablet Take 1-2 tablets by mouth every 4 (four) hours as needed for moderate pain. Qty: 30 tablet, Refills: 0    Multiple Vitamin (MULTIVITAMIN WITH MINERALS) TABS tablet Take 1 tablet by mouth daily.    QUEtiapine (SEROQUEL) 50 MG tablet Take 1 tablet (50 mg total) by mouth at bedtime. Qty: 30 tablet, Refills: 0    risperiDONE (RISPERDAL) 0.5 MG tablet Take 0.5 mg by mouth at bedtime.    simvastatin (ZOCOR) 20 MG tablet Take 20 mg by mouth every evening.      STOP taking these medications     omeprazole (PRILOSEC) 20 MG capsule          DISCHARGE INSTRUCTIONS:    DIET:  Regular diet  DISCHARGE CONDITION:  Stable  ACTIVITY:  Activity as tolerated  OXYGEN:  Home Oxygen: No.   Oxygen Delivery: room air  DISCHARGE LOCATION:  home   If you experience worsening of your admission symptoms, develop shortness of breath, life threatening emergency, suicidal or homicidal thoughts you must seek medical attention immediately by calling 911 or calling your MD immediately  if symptoms less severe.  You  Must read complete instructions/literature along with all the possible adverse reactions/side effects for all the Medicines you take and that have been prescribed to you. Take any new Medicines after you have completely understood and accpet all the possible adverse reactions/side effects.   Please note  You were cared for by a hospitalist during your hospital stay. If you have any questions about your discharge medications or the care you received while you were in the hospital after you are discharged, you can call the unit and  asked to speak with the hospitalist on call if the hospitalist that took care of you is not available. Once you are discharged, your primary care physician will handle any further medical issues. Please note that NO REFILLS for any discharge medications will be authorized once you are discharged, as it is imperative that you return to your primary care physician (or establish a relationship with a primary care physician if you do not have one) for your aftercare needs so that they can reassess your need for medications and monitor your lab values.    On the day of Discharge:   VITAL SIGNS:  Blood pressure 127/77, pulse 95, temperature 97.8 F (36.6 C), resp. rate 18, height '5\' 3"'$  (1.6 m), weight 67.1 kg (147 lb 14.4 oz), SpO2 93 %.  I/O:   Intake/Output Summary (Last 24 hours) at 01/31/16 1059 Last data filed at 01/31/16 0700  Gross per 24 hour  Intake          2536.24 ml  Output                0 ml  Net          2536.24 ml    PHYSICAL EXAMINATION:  GENERAL:  75 y.o.-year-old patient lying in the bed with no acute distress.  EYES: Pupils equal, round, reactive to light and accommodation. No scleral icterus. Extraocular muscles intact.  HEENT: Head atraumatic, normocephalic. Oropharynx and nasopharynx clear.  NECK:  Supple, no jugular venous distention. No thyroid enlargement, no tenderness.  LUNGS: Normal breath sounds bilaterally, no wheezing, rales,rhonchi or crepitation. No use of accessory muscles of respiration.  CARDIOVASCULAR: S1, S2 normal. No murmurs, rubs, or gallops.  ABDOMEN: Soft, non-tender, non-distended. Bowel sounds present. No organomegaly or mass.  EXTREMITIES: No pedal edema, cyanosis, or clubbing.  NEUROLOGIC: Cranial nerves II through XII are intact. Muscle strength 5/5 in all extremities. Sensation intact. Gait not checked.  PSYCHIATRIC: The patient is alert and oriented x 3.  SKIN: No obvious rash, lesion, or ulcer.   DATA REVIEW:   CBC  Recent Labs Lab  01/31/16 0605  WBC 9.1  HGB 13.6  HCT 38.9  PLT 142*    Chemistries   Recent Labs Lab 01/30/16 0638  NA 138  K 3.8  CL 100*  CO2 29  GLUCOSE 164*  BUN 32*  CREATININE 0.94  CALCIUM 8.4*  AST 26  ALT 31  ALKPHOS 70  BILITOT 0.9    Cardiac Enzymes  Recent Labs Lab 01/30/16 2326  TROPONINI 0.05*    Microbiology Results  Results for orders placed or performed during the hospital encounter of 01/01/16  MRSA PCR Screening     Status: None   Collection Time: 01/01/16  8:14 PM  Result Value Ref Range Status   MRSA by PCR NEGATIVE NEGATIVE Final    Comment:        The GeneXpert MRSA Assay (FDA approved for NASAL specimens only), is one component of a comprehensive MRSA  colonization surveillance program. It is not intended to diagnose MRSA infection nor to guide or monitor treatment for MRSA infections.     RADIOLOGY:  Dg Abdomen Acute W/chest  Result Date: 01/30/2016 CLINICAL DATA:  75 year old female with a history of intermittent abdominal pain EXAM: DG ABDOMEN ACUTE W/ 1V CHEST COMPARISON:  CT chest 01/01/2016, plain film 01/01/2016 FINDINGS: Chest: Cardiomediastinal silhouette unchanged. Masslike opacity in the left upper lobe, compatible with known tumor identified on prior plain film and CT. Linear opacities of the left lung, new from the plain film comparison. Fullness in the right hilar region. No pneumothorax.  No large pleural effusion. Calcified thyroid nodules again noted. Abdomen: Gas within stomach, small bowel, colon, without abnormal distention. Rounded calcifications in the right upper abdomen at the inferior liver margin may be within kidney or gallbladder. Fibroid changes of the uterus. No radiopaque foreign body. No displaced fracture. Degenerative changes of the spine. IMPRESSION: Chest: Low lung volumes with bilateral atelectasis. Re- demonstration of known left lung mass, better characterized on recent CT. Abdomen: Nonobstructive bowel gas  pattern. Rounded calcifications at the inferior liver margin, potentially within gallbladder or the right kidney collecting system. Fibroid changes of the uterus. Signed, Dulcy Fanny. Earleen Newport, DO [facility] Radiology Electronically Signed   By: Corrie Mckusick D.O.   On: 01/30/2016 08:49     Management plans discussed with the patient, family and they are in agreement.  CODE STATUS:     Code Status Orders        Start     Ordered   01/30/16 0841  Do not attempt resuscitation (DNR)  Continuous    Question Answer Comment  In the event of cardiac or respiratory ARREST Do not call a "code blue"   In the event of cardiac or respiratory ARREST Do not perform Intubation, CPR, defibrillation or ACLS   In the event of cardiac or respiratory ARREST Use medication by any route, position, wound care, and other measures to relive pain and suffering. May use oxygen, suction and manual treatment of airway obstruction as needed for comfort.      01/30/16 0841    Code Status History    Date Active Date Inactive Code Status Order ID Comments User Context   01/08/2016  7:56 AM 01/09/2016  6:28 PM DNR [id number]  Micheline Rough, MD Inpatient   01/01/2016  9:34 PM 01/08/2016  7:56 AM Full Code [id number]  Vianne Bulls, MD Inpatient      TOTAL TIME TAKING CARE OF THIS PATIENT: 33 minutes.    Merl Bommarito,  Karenann Cai.D on 01/31/2016 at 10:59 AM  Between 7am to 6pm - Pager - [phone]  After 6pm go to [email or web address] - Proofreader  [facility]  Office  [phone]  CC: Primary care physician; Juluis Pitch, MD

## 2016-02-01 DIAGNOSIS — Z5181 Encounter for therapeutic drug level monitoring: Secondary | ICD-10-CM | POA: Diagnosis not present

## 2016-02-02 ENCOUNTER — Inpatient Hospital Stay: Payer: Medicare Other | Admitting: Anesthesiology

## 2016-02-02 ENCOUNTER — Inpatient Hospital Stay: Payer: Medicare Other

## 2016-02-02 ENCOUNTER — Ambulatory Visit: Payer: Medicare Other

## 2016-02-02 ENCOUNTER — Emergency Department: Payer: Medicare Other

## 2016-02-02 ENCOUNTER — Inpatient Hospital Stay
Admission: EM | Admit: 2016-02-02 | Discharge: 2016-02-05 | DRG: 492 | Disposition: A | Discharge status: Home Health | Payer: Medicare Other | Attending: Internal Medicine | Admitting: Internal Medicine

## 2016-02-02 ENCOUNTER — Encounter
Admission: EM | Disposition: A | Discharge status: Home Health | Payer: Self-pay | Source: Home / Self Care | Attending: Orthopedic Surgery

## 2016-02-02 ENCOUNTER — Encounter: Payer: Self-pay | Admitting: Intensive Care

## 2016-02-02 DIAGNOSIS — R531 Weakness: Secondary | ICD-10-CM

## 2016-02-02 DIAGNOSIS — C3490 Malignant neoplasm of unspecified part of unspecified bronchus or lung: Secondary | ICD-10-CM

## 2016-02-02 DIAGNOSIS — R0902 Hypoxemia: Secondary | ICD-10-CM | POA: Diagnosis not present

## 2016-02-02 DIAGNOSIS — J811 Chronic pulmonary edema: Secondary | ICD-10-CM | POA: Diagnosis not present

## 2016-02-02 DIAGNOSIS — W19XXXA Unspecified fall, initial encounter: Secondary | ICD-10-CM | POA: Diagnosis present

## 2016-02-02 DIAGNOSIS — C3412 Malignant neoplasm of upper lobe, left bronchus or lung: Secondary | ICD-10-CM | POA: Diagnosis present

## 2016-02-02 DIAGNOSIS — R159 Full incontinence of feces: Secondary | ICD-10-CM

## 2016-02-02 DIAGNOSIS — R0602 Shortness of breath: Secondary | ICD-10-CM | POA: Diagnosis not present

## 2016-02-02 DIAGNOSIS — I129 Hypertensive chronic kidney disease with stage 1 through stage 4 chronic kidney disease, or unspecified chronic kidney disease: Secondary | ICD-10-CM | POA: Diagnosis not present

## 2016-02-02 DIAGNOSIS — R32 Unspecified urinary incontinence: Secondary | ICD-10-CM

## 2016-02-02 DIAGNOSIS — Z66 Do not resuscitate: Secondary | ICD-10-CM | POA: Diagnosis present

## 2016-02-02 DIAGNOSIS — S8291XA Unspecified fracture of right lower leg, initial encounter for closed fracture: Secondary | ICD-10-CM | POA: Diagnosis not present

## 2016-02-02 DIAGNOSIS — N289 Disorder of kidney and ureter, unspecified: Secondary | ICD-10-CM | POA: Diagnosis not present

## 2016-02-02 DIAGNOSIS — I130 Hypertensive heart and chronic kidney disease with heart failure and stage 1 through stage 4 chronic kidney disease, or unspecified chronic kidney disease: Secondary | ICD-10-CM | POA: Diagnosis present

## 2016-02-02 DIAGNOSIS — F17200 Nicotine dependence, unspecified, uncomplicated: Secondary | ICD-10-CM | POA: Diagnosis present

## 2016-02-02 DIAGNOSIS — E669 Obesity, unspecified: Secondary | ICD-10-CM | POA: Diagnosis present

## 2016-02-02 DIAGNOSIS — L89151 Pressure ulcer of sacral region, stage 1: Secondary | ICD-10-CM

## 2016-02-02 DIAGNOSIS — N183 Chronic kidney disease, stage 3 (moderate): Secondary | ICD-10-CM | POA: Diagnosis present

## 2016-02-02 DIAGNOSIS — Z9889 Other specified postprocedural states: Secondary | ICD-10-CM

## 2016-02-02 DIAGNOSIS — E876 Hypokalemia: Secondary | ICD-10-CM | POA: Diagnosis present

## 2016-02-02 DIAGNOSIS — M6281 Muscle weakness (generalized): Secondary | ICD-10-CM

## 2016-02-02 DIAGNOSIS — I5031 Acute diastolic (congestive) heart failure: Secondary | ICD-10-CM | POA: Diagnosis not present

## 2016-02-02 DIAGNOSIS — J9621 Acute and chronic respiratory failure with hypoxia: Secondary | ICD-10-CM | POA: Diagnosis not present

## 2016-02-02 DIAGNOSIS — S82841A Displaced bimalleolar fracture of right lower leg, initial encounter for closed fracture: Secondary | ICD-10-CM | POA: Diagnosis present

## 2016-02-02 DIAGNOSIS — S9301XA Subluxation of right ankle joint, initial encounter: Secondary | ICD-10-CM | POA: Diagnosis not present

## 2016-02-02 DIAGNOSIS — S93491A Sprain of other ligament of right ankle, initial encounter: Secondary | ICD-10-CM | POA: Diagnosis present

## 2016-02-02 DIAGNOSIS — Z6828 Body mass index (BMI) 28.0-28.9, adult: Secondary | ICD-10-CM

## 2016-02-02 DIAGNOSIS — J9811 Atelectasis: Secondary | ICD-10-CM | POA: Diagnosis not present

## 2016-02-02 DIAGNOSIS — S8251XA Displaced fracture of medial malleolus of right tibia, initial encounter for closed fracture: Secondary | ICD-10-CM | POA: Diagnosis not present

## 2016-02-02 DIAGNOSIS — J9601 Acute respiratory failure with hypoxia: Secondary | ICD-10-CM | POA: Diagnosis not present

## 2016-02-02 DIAGNOSIS — C7931 Secondary malignant neoplasm of brain: Secondary | ICD-10-CM | POA: Diagnosis present

## 2016-02-02 DIAGNOSIS — S82899A Other fracture of unspecified lower leg, initial encounter for closed fracture: Secondary | ICD-10-CM | POA: Diagnosis not present

## 2016-02-02 DIAGNOSIS — Z8781 Personal history of (healed) traumatic fracture: Secondary | ICD-10-CM

## 2016-02-02 DIAGNOSIS — R918 Other nonspecific abnormal finding of lung field: Secondary | ICD-10-CM | POA: Diagnosis not present

## 2016-02-02 DIAGNOSIS — L8990 Pressure ulcer of unspecified site, unspecified stage: Secondary | ICD-10-CM | POA: Insufficient documentation

## 2016-02-02 DIAGNOSIS — Y92009 Unspecified place in unspecified non-institutional (private) residence as the place of occurrence of the external cause: Secondary | ICD-10-CM | POA: Diagnosis not present

## 2016-02-02 DIAGNOSIS — S8251XD Displaced fracture of medial malleolus of right tibia, subsequent encounter for closed fracture with routine healing: Secondary | ICD-10-CM | POA: Diagnosis not present

## 2016-02-02 DIAGNOSIS — M25571 Pain in right ankle and joints of right foot: Secondary | ICD-10-CM | POA: Diagnosis not present

## 2016-02-02 DIAGNOSIS — Z5181 Encounter for therapeutic drug level monitoring: Secondary | ICD-10-CM | POA: Diagnosis not present

## 2016-02-02 HISTORY — DX: Neoplasm of unspecified behavior of brain: D49.6

## 2016-02-02 HISTORY — PX: ORIF ANKLE FRACTURE: SHX5408

## 2016-02-02 LAB — BASIC METABOLIC PANEL
Anion gap: 8 (ref 5–15)
BUN: 31 mg/dL — AB (ref 6–20)
CHLORIDE: 108 mmol/L (ref 101–111)
CO2: 20 mmol/L — ABNORMAL LOW (ref 22–32)
CREATININE: 1.12 mg/dL — AB (ref 0.44–1.00)
Calcium: 8.1 mg/dL — ABNORMAL LOW (ref 8.9–10.3)
GFR calc Af Amer: 54 mL/min — ABNORMAL LOW (ref >60)
GFR calc non Af Amer: 46 mL/min — ABNORMAL LOW (ref >60)
Glucose, Bld: 141 mg/dL — ABNORMAL HIGH (ref 65–99)
Potassium: 4 mmol/L (ref 3.5–5.1)
SODIUM: 136 mmol/L (ref 135–145)

## 2016-02-02 LAB — CBC
HEMATOCRIT: 37.9 % (ref 35.0–47.0)
HEMOGLOBIN: 13 g/dL (ref 12.0–16.0)
MCH: 31.4 pg (ref 26.0–34.0)
MCHC: 34.3 g/dL (ref 32.0–36.0)
MCV: 91.7 fL (ref 80.0–100.0)
Platelets: 192 10*3/uL (ref 150–440)
RBC: 4.14 MIL/uL (ref 3.80–5.20)
RDW: 17 % — ABNORMAL HIGH (ref 11.5–14.5)
WBC: 12.1 10*3/uL — ABNORMAL HIGH (ref 3.6–11.0)

## 2016-02-02 LAB — SURGICAL PCR SCREEN
MRSA, PCR: NEGATIVE
STAPHYLOCOCCUS AUREUS: NEGATIVE

## 2016-02-02 SURGERY — OPEN REDUCTION INTERNAL FIXATION (ORIF) ANKLE FRACTURE
Anesthesia: General | Site: Ankle | Laterality: Right | Wound class: Clean

## 2016-02-02 MED ORDER — ACETAMINOPHEN 10 MG/ML IV SOLN
INTRAVENOUS | Status: DC | PRN
  Administered 2016-02-02: 1000 mg via INTRAVENOUS

## 2016-02-02 MED ORDER — MAGNESIUM CITRATE PO SOLN
1.0000 | Freq: Once | ORAL | Status: DC | PRN
  Filled 2016-02-02: qty 296

## 2016-02-02 MED ORDER — BISACODYL 10 MG RE SUPP: 10.0000 mg | Freq: Every day | RECTAL | Status: DC | PRN

## 2016-02-02 MED ORDER — RISPERIDONE 1 MG PO TABS
0.5000 mg | ORAL_TABLET | Freq: Every day | ORAL | Status: DC
  Administered 2016-02-02 – 2016-02-04 (×3): 0.5 mg via ORAL
  Filled 2016-02-02 (×3): qty 1

## 2016-02-02 MED ORDER — SIMVASTATIN 20 MG PO TABS
20.0000 mg | ORAL_TABLET | Freq: Every evening | ORAL | Status: DC
  Administered 2016-02-03 – 2016-02-04 (×2): 20 mg via ORAL
  Filled 2016-02-02 (×2): qty 1

## 2016-02-02 MED ORDER — EPHEDRINE SULFATE 50 MG/ML IJ SOLN
INTRAMUSCULAR | Status: DC | PRN
  Administered 2016-02-02 (×2): 10 mg via INTRAVENOUS
  Administered 2016-02-02: 5 mg via INTRAVENOUS

## 2016-02-02 MED ORDER — ACETAMINOPHEN 10 MG/ML IV SOLN
INTRAVENOUS | Status: AC
  Filled 2016-02-02: qty 100

## 2016-02-02 MED ORDER — MAGNESIUM HYDROXIDE 400 MG/5ML PO SUSP
30.0000 mL | Freq: Every day | ORAL | Status: DC | PRN
  Administered 2016-02-04: 30 mL via ORAL
  Filled 2016-02-02: qty 30

## 2016-02-02 MED ORDER — ACETAMINOPHEN 650 MG RE SUPP: 650.0000 mg | Freq: Four times a day (QID) | RECTAL | Status: DC | PRN

## 2016-02-02 MED ORDER — CITALOPRAM HYDROBROMIDE 20 MG PO TABS
40.0000 mg | ORAL_TABLET | Freq: Every day | ORAL | Status: DC
  Administered 2016-02-03 – 2016-02-05 (×3): 40 mg via ORAL
  Filled 2016-02-02 (×3): qty 2

## 2016-02-02 MED ORDER — LIDOCAINE HCL (CARDIAC) 20 MG/ML IV SOLN
INTRAVENOUS | Status: DC | PRN
  Administered 2016-02-02: 50 mg via INTRAVENOUS

## 2016-02-02 MED ORDER — CEFAZOLIN SODIUM-DEXTROSE 2-4 GM/100ML-% IV SOLN
2.0000 g | Freq: Four times a day (QID) | INTRAVENOUS | Status: AC
  Administered 2016-02-02 – 2016-02-03 (×3): 2 g via INTRAVENOUS
  Filled 2016-02-02 (×3): qty 100

## 2016-02-02 MED ORDER — PROPOFOL 10 MG/ML IV BOLUS
INTRAVENOUS | Status: DC | PRN
  Administered 2016-02-02: 90 mg via INTRAVENOUS

## 2016-02-02 MED ORDER — ONDANSETRON HCL 4 MG/2ML IJ SOLN: 4.0000 mg | Freq: Once | INTRAMUSCULAR | Status: DC | PRN

## 2016-02-02 MED ORDER — FENTANYL CITRATE (PF) 100 MCG/2ML IJ SOLN
50.0000 ug | Freq: Once | INTRAMUSCULAR | Status: AC
  Administered 2016-02-02: 50 ug via INTRAVENOUS

## 2016-02-02 MED ORDER — CARVEDILOL 12.5 MG PO TABS
ORAL_TABLET | ORAL | Status: AC
  Filled 2016-02-02: qty 1

## 2016-02-02 MED ORDER — PANTOPRAZOLE SODIUM 40 MG PO TBEC
40.0000 mg | DELAYED_RELEASE_TABLET | Freq: Every day | ORAL | Status: DC
  Administered 2016-02-03 – 2016-02-05 (×3): 40 mg via ORAL
  Filled 2016-02-02 (×3): qty 1

## 2016-02-02 MED ORDER — SUCCINYLCHOLINE CHLORIDE 20 MG/ML IJ SOLN
INTRAMUSCULAR | Status: DC | PRN
  Administered 2016-02-02: 100 mg via INTRAVENOUS

## 2016-02-02 MED ORDER — CALCIUM CARBONATE ANTACID 500 MG PO CHEW: 2.0000 | CHEWABLE_TABLET | ORAL | Status: DC | PRN

## 2016-02-02 MED ORDER — QUETIAPINE FUMARATE 25 MG PO TABS
50.0000 mg | ORAL_TABLET | Freq: Two times a day (BID) | ORAL | Status: DC
  Administered 2016-02-02 – 2016-02-05 (×6): 50 mg via ORAL
  Filled 2016-02-02 (×6): qty 2

## 2016-02-02 MED ORDER — KETAMINE HCL 50 MG/ML IJ SOLN
INTRAMUSCULAR | Status: DC | PRN
  Administered 2016-02-02: 25 mg via INTRAMUSCULAR
  Administered 2016-02-02: 25 mg via INTRAVENOUS

## 2016-02-02 MED ORDER — FENTANYL CITRATE (PF) 100 MCG/2ML IJ SOLN
25.0000 ug | INTRAMUSCULAR | Status: DC | PRN
  Administered 2016-02-02 (×2): 25 ug via INTRAVENOUS

## 2016-02-02 MED ORDER — DOCUSATE SODIUM 100 MG PO CAPS
100.0000 mg | ORAL_CAPSULE | Freq: Two times a day (BID) | ORAL | Status: DC
  Administered 2016-02-02 – 2016-02-05 (×6): 100 mg via ORAL
  Filled 2016-02-02 (×7): qty 1

## 2016-02-02 MED ORDER — SODIUM CHLORIDE 0.9 % IV SOLN
INTRAVENOUS | Status: DC
  Administered 2016-02-02: 1000 mL via INTRAVENOUS

## 2016-02-02 MED ORDER — LACTATED RINGERS IV SOLN
INTRAVENOUS | Status: DC
  Administered 2016-02-02 – 2016-02-03 (×2): via INTRAVENOUS

## 2016-02-02 MED ORDER — OXYCODONE HCL 5 MG PO TABS
5.0000 mg | ORAL_TABLET | ORAL | Status: DC | PRN
  Administered 2016-02-03 – 2016-02-05 (×2): 5 mg via ORAL
  Filled 2016-02-02 (×2): qty 1

## 2016-02-02 MED ORDER — FENTANYL CITRATE (PF) 100 MCG/2ML IJ SOLN
INTRAMUSCULAR | Status: AC
  Administered 2016-02-02: 50 ug via INTRAVENOUS
  Filled 2016-02-02: qty 2

## 2016-02-02 MED ORDER — METOCLOPRAMIDE HCL 10 MG PO TABS: 5.0000 mg | ORAL_TABLET | Freq: Three times a day (TID) | ORAL | Status: DC | PRN

## 2016-02-02 MED ORDER — SODIUM CHLORIDE 0.9 % IV BOLUS (SEPSIS)
1000.0000 mL | Freq: Once | INTRAVENOUS | Status: AC
  Administered 2016-02-02: 1000 mL via INTRAVENOUS

## 2016-02-02 MED ORDER — VASOPRESSIN 20 UNIT/ML IV SOLN
INTRAVENOUS | Status: DC | PRN
  Administered 2016-02-02 (×2): 2 [IU] via INTRAVENOUS

## 2016-02-02 MED ORDER — GLYCOPYRROLATE 0.2 MG/ML IJ SOLN
INTRAMUSCULAR | Status: DC | PRN
  Administered 2016-02-02: 0.2 mg via INTRAVENOUS

## 2016-02-02 MED ORDER — ONDANSETRON HCL 4 MG/2ML IJ SOLN: 4.0000 mg | Freq: Four times a day (QID) | INTRAMUSCULAR | Status: DC | PRN

## 2016-02-02 MED ORDER — ONDANSETRON HCL 4 MG PO TABS: 4.0000 mg | ORAL_TABLET | Freq: Four times a day (QID) | ORAL | Status: DC | PRN

## 2016-02-02 MED ORDER — ASPIRIN EC 81 MG PO TBEC
81.0000 mg | DELAYED_RELEASE_TABLET | Freq: Every day | ORAL | Status: DC
  Administered 2016-02-03 – 2016-02-05 (×3): 81 mg via ORAL
  Filled 2016-02-02 (×3): qty 1

## 2016-02-02 MED ORDER — CARVEDILOL 6.25 MG PO TABS
12.5000 mg | ORAL_TABLET | Freq: Two times a day (BID) | ORAL | Status: DC
  Administered 2016-02-02 – 2016-02-05 (×6): 12.5 mg via ORAL
  Filled 2016-02-02 (×7): qty 2

## 2016-02-02 MED ORDER — FENTANYL CITRATE (PF) 100 MCG/2ML IJ SOLN
INTRAMUSCULAR | Status: DC | PRN
  Administered 2016-02-02 (×2): 50 ug via INTRAVENOUS

## 2016-02-02 MED ORDER — MORPHINE SULFATE (PF) 2 MG/ML IV SOLN: 2.0000 mg | INTRAVENOUS | Status: DC | PRN

## 2016-02-02 MED ORDER — ONDANSETRON HCL 4 MG/2ML IJ SOLN
INTRAMUSCULAR | Status: DC | PRN
  Administered 2016-02-02: 4 mg via INTRAVENOUS

## 2016-02-02 MED ORDER — DEXAMETHASONE 4 MG PO TABS
4.0000 mg | ORAL_TABLET | Freq: Three times a day (TID) | ORAL | Status: DC
  Administered 2016-02-02 – 2016-02-05 (×9): 4 mg via ORAL
  Filled 2016-02-02 (×10): qty 1

## 2016-02-02 MED ORDER — FENTANYL CITRATE (PF) 100 MCG/2ML IJ SOLN
INTRAMUSCULAR | Status: AC
  Filled 2016-02-02: qty 2

## 2016-02-02 MED ORDER — HYDROCODONE-ACETAMINOPHEN 5-325 MG PO TABS
1.0000 | ORAL_TABLET | ORAL | Status: DC | PRN
  Administered 2016-02-02 – 2016-02-04 (×7): 1 via ORAL
  Filled 2016-02-02 (×7): qty 1

## 2016-02-02 MED ORDER — ACETAMINOPHEN 325 MG PO TABS: 650.0000 mg | ORAL_TABLET | Freq: Four times a day (QID) | ORAL | Status: DC | PRN

## 2016-02-02 MED ORDER — ADULT MULTIVITAMIN W/MINERALS CH
1.0000 | ORAL_TABLET | Freq: Every day | ORAL | Status: DC
  Administered 2016-02-03 – 2016-02-05 (×3): 1 via ORAL
  Filled 2016-02-02 (×3): qty 1

## 2016-02-02 MED ORDER — BUSPIRONE HCL 5 MG PO TABS
7.5000 mg | ORAL_TABLET | Freq: Two times a day (BID) | ORAL | Status: DC
Start: 2016-02-02 — End: 2016-02-05
  Administered 2016-02-02 – 2016-02-05 (×6): 7.5 mg via ORAL
  Filled 2016-02-02 (×6): qty 2

## 2016-02-02 MED ORDER — PHENYLEPHRINE HCL 10 MG/ML IJ SOLN
INTRAMUSCULAR | Status: DC | PRN
  Administered 2016-02-02 (×3): 200 ug via INTRAVENOUS
  Administered 2016-02-02 (×2): 100 ug via INTRAVENOUS
  Administered 2016-02-02: 300 ug via INTRAVENOUS

## 2016-02-02 MED ORDER — NEOMYCIN-POLYMYXIN B GU 40-200000 IR SOLN
Status: AC
  Filled 2016-02-02: qty 4

## 2016-02-02 MED ORDER — METOCLOPRAMIDE HCL 5 MG/ML IJ SOLN: 5.0000 mg | Freq: Three times a day (TID) | INTRAMUSCULAR | Status: DC | PRN

## 2016-02-02 MED ORDER — CARVEDILOL 12.5 MG PO TABS
12.5000 mg | ORAL_TABLET | Freq: Once | ORAL | Status: AC
Start: 2016-02-02 — End: 2016-02-02
  Administered 2016-02-02: 12.5 mg via ORAL

## 2016-02-02 MED ORDER — SODIUM CHLORIDE FLUSH 0.9 % IV SOLN
INTRAVENOUS | Status: AC
  Filled 2016-02-02: qty 10

## 2016-02-02 MED ORDER — NEOMYCIN-POLYMYXIN B GU 40-200000 IR SOLN
Status: DC | PRN
  Administered 2016-02-02: 4 mL

## 2016-02-02 MED ORDER — CEFAZOLIN SODIUM-DEXTROSE 2-4 GM/100ML-% IV SOLN
2.0000 g | INTRAVENOUS | Status: AC
  Administered 2016-02-02: 2 g via INTRAVENOUS
  Filled 2016-02-02: qty 100

## 2016-02-02 SURGICAL SUPPLY — 59 items
BANDAGE ACE 4X5 VEL STRL LF (GAUZE/BANDAGES/DRESSINGS) ×6 IMPLANT
BIT DRILL 2.5X2.75 QC CALB (BIT) ×3 IMPLANT
BIT DRILL 2.9 CANN QC NONSTRL (BIT) ×3 IMPLANT
BLADE SURG SZ10 CARB STEEL (BLADE) ×6 IMPLANT
BNDG ESMARK 4X12 TAN STRL LF (GAUZE/BANDAGES/DRESSINGS) ×3 IMPLANT
CANISTER SUCT 1200ML W/VALVE (MISCELLANEOUS) ×3 IMPLANT
CHLORAPREP W/TINT 26ML (MISCELLANEOUS) ×3 IMPLANT
CUFF TOURN 24 STER (MISCELLANEOUS) ×3 IMPLANT
CUFF TOURN 30 STER DUAL PORT (MISCELLANEOUS) IMPLANT
DRAPE FLUOR MINI C-ARM 54X84 (DRAPES) ×3 IMPLANT
DRAPE INCISE IOBAN 66X45 STRL (DRAPES) ×3 IMPLANT
DRAPE U-SHAPE 47X51 STRL (DRAPES) ×3 IMPLANT
DRSG EMULSION OIL 3X8 NADH (GAUZE/BANDAGES/DRESSINGS) ×3 IMPLANT
ELECT CAUTERY BLADE 6.4 (BLADE) ×3 IMPLANT
ELECT REM PT RETURN 9FT ADLT (ELECTROSURGICAL) ×3
ELECTRODE REM PT RTRN 9FT ADLT (ELECTROSURGICAL) ×1 IMPLANT
FIXATION ZIPTIGHT ANKLE SNDSMS (Ankle) ×1 IMPLANT
GAUZE PETRO XEROFOAM 1X8 (MISCELLANEOUS) ×3 IMPLANT
GAUZE SPONGE 4X4 12PLY STRL (GAUZE/BANDAGES/DRESSINGS) ×3 IMPLANT
GLOVE BIOGEL PI IND STRL 9 (GLOVE) ×1 IMPLANT
GLOVE BIOGEL PI INDICATOR 9 (GLOVE) ×2
GLOVE INDICATOR 7.5 STRL GRN (GLOVE) ×3 IMPLANT
GLOVE SURG SYN 9.0  PF PI (GLOVE) ×2
GLOVE SURG SYN 9.0 PF PI (GLOVE) ×1 IMPLANT
GOWN SRG 2XL LVL 4 RGLN SLV (GOWNS) ×1 IMPLANT
GOWN STRL NON-REIN 2XL LVL4 (GOWNS) ×2
GOWN STRL REUS W/ TWL LRG LVL3 (GOWN DISPOSABLE) ×1 IMPLANT
GOWN STRL REUS W/TWL LRG LVL3 (GOWN DISPOSABLE) ×2
HEMOVAC 400ML (MISCELLANEOUS)
K-WIRE ACE 1.6X6 (WIRE) ×6
KIT DRAIN HEMOVAC JP 7FR 400ML (MISCELLANEOUS) IMPLANT
KIT RM TURNOVER STRD PROC AR (KITS) ×3 IMPLANT
KWIRE ACE 1.6X6 (WIRE) ×2 IMPLANT
LABEL OR SOLS (LABEL) IMPLANT
NS IRRIG 1000ML POUR BTL (IV SOLUTION) ×3 IMPLANT
PACK EXTREMITY ARMC (MISCELLANEOUS) ×3 IMPLANT
PAD ABD DERMACEA PRESS 5X9 (GAUZE/BANDAGES/DRESSINGS) ×6 IMPLANT
PAD CAST CTTN 4X4 STRL (SOFTGOODS) ×2 IMPLANT
PAD PREP 24X41 OB/GYN DISP (PERSONAL CARE ITEMS) ×3 IMPLANT
PADDING CAST COTTON 4X4 STRL (SOFTGOODS) ×4
PLATE LOCK 6H 77 BILAT FIB (Plate) ×3 IMPLANT
SCREW LOCK CORT STAR 3.5X10 (Screw) ×3 IMPLANT
SCREW LOCK CORT STAR 3.5X12 (Screw) ×6 IMPLANT
SCREW NON LOCKING LP 3.5 14MM (Screw) ×3 IMPLANT
SCREW NON LOCKING LP 3.5 16MM (Screw) ×3 IMPLANT
SPLINT CAST 1 STEP 5X30 WHT (MISCELLANEOUS) ×3 IMPLANT
SPONGE LAP 18X18 5 PK (GAUZE/BANDAGES/DRESSINGS) ×3 IMPLANT
STAPLER SKIN PROX 35W (STAPLE) ×3 IMPLANT
STOCKINETTE STRL 6IN 960660 (GAUZE/BANDAGES/DRESSINGS) ×3 IMPLANT
SUT ETHILON 3-0 FS-10 30 BLK (SUTURE) ×3
SUT MNCRL AB 4-0 PS2 18 (SUTURE) ×6 IMPLANT
SUT VIC AB 0 CT1 36 (SUTURE) ×3 IMPLANT
SUT VIC AB 2-0 SH 27 (SUTURE) ×4
SUT VIC AB 2-0 SH 27XBRD (SUTURE) ×2 IMPLANT
SUT VIC AB 3-0 SH 27 (SUTURE)
SUT VIC AB 3-0 SH 27X BRD (SUTURE) IMPLANT
SUTURE EHLN 3-0 FS-10 30 BLK (SUTURE) ×1 IMPLANT
SYRINGE 10CC LL (SYRINGE) ×3 IMPLANT
ZIPTIGHT ANKLE SYNODESMOSS FIX (Ankle) ×3 IMPLANT

## 2016-02-02 NOTE — ED Notes (Signed)
Floor informed pt in on the way

## 2016-02-02 NOTE — Op Note (Signed)
02/02/2016  3:47 PM  PATIENT:  Alexis Buck  75 y.o. female  PRE-OPERATIVE DIAGNOSIS:  RIGHT ANKLE FRACTURE bimalleolar  POST-OPERATIVE DIAGNOSIS:  RIGHT ANKLE FRACTURE bimalleolar with syndesmosis rupture  PROCEDURE:  Procedure(s): OPEN REDUCTION INTERNAL FIXATION (ORIF) DISTAL ANKLE FRACTURE WITH SENODESMOSIS REPAIR (Right)  SURGEON: Laurene Footman, MD  ASSISTANTS: None  ANESTHESIA:   general  EBL:  Total I/O In: 800 [I.V.:800] Out: -   BLOOD ADMINISTERED:none  DRAINS: none   LOCAL MEDICATIONS USED:  NONE  SPECIMEN:  No Specimen  DISPOSITION OF SPECIMEN:  N/A  COUNTS:  YES  TOURNIQUET:   39 minutes at 300 mmHg   IMPLANTS: Biomet composite plate distal fibula with multiple screws and tight rope  DICTATION: .Dragon Dictation patient brought the operating room and after adequate anesthesia was obtained a bump was placed underneath right buttock. After prepping and draping in usual sterile fashion appropriate patient identification and timeout procedure was performed. Tourniquet was raised. After palpating landmarks the lateral approach is made to the distal fibula and the fracture exposed and a clamp used to reduce this the fracture was quite unstable when there did appear to be some syndesmosis involvement. A 6-hole plate was then contoured to fit the distal fibula and fixed with multiple screws nonlocking proximal to get the plate down to the bone and then a nonlocking and locking screws distally to give additional fixation next under strut structural stress views the syndesmosis widened after repair of the lateral side and the medial malleolus fragment appeared to be anatomically reduced and a beak fairly fairly small and not necessarily a fragment requiring ORIF her skin on the medial side had been damaged from the bone pressed against it and it was felt that the medial malleolus could be left alone but she did need a syndesmosis repair this was performed by removing one of  the locking screws drilling the wire over drilling and then passing the tight rope anchor tightening this down the stability and on stress views at the close the case the ankle was stable sutures were cut and removed from the tight rope and the wound irrigated and closed with 3-0 Vicryl subcutaneously and skin staples. Xeroform 4 x 4's web roll and a short leg cast were applied tourniquet is let down tourniquet 39 minutes or occasions and no specimen  PLAN OF CARE: Continue as inpatient  PATIENT DISPOSITION:  PACU - hemodynamically stable.

## 2016-02-02 NOTE — Anesthesia Preprocedure Evaluation (Addendum)
Anesthesia Evaluation  Patient identified by MRN, date of birth, ID band Patient confused    Reviewed: Allergy & Precautions, H&P , NPO status , Patient's Chart, lab work & pertinent test results  Airway Mallampati: III  TM Distance: >3 FB Neck ROM: limited    Dental no notable dental hx. (+) Poor Dentition, Missing, Edentulous Upper, Edentulous Lower   Pulmonary shortness of breath and with exertion,    Pulmonary exam normal breath sounds clear to auscultation       Cardiovascular Exercise Tolerance: Poor hypertension, (-) Past MI Normal cardiovascular exam Rhythm:regular Rate:Normal     Neuro/Psych negative neurological ROS  negative psych ROS   GI/Hepatic negative GI ROS, Neg liver ROS,   Endo/Other  negative endocrine ROS  Renal/GU Renal disease     Musculoskeletal   Abdominal   Peds  Hematology negative hematology ROS (+)   Anesthesia Other Findings Past Medical History: 2009: Benign neoplasm of breast No date: Brain tumor ([place]) No date: Cancer ([place]) No date: Hypertension  Past Surgical History: 2009: BREAST BIOPSY Left No date: CESAREAN SECTION 2006: COLONOSCOPY     Comment: Dr.KHAN 2006: POLYPECTOMY 01/05/2016: RADIOLOGY WITH ANESTHESIA N/A     Comment: Procedure: MRI BRAIN WITH AND WITHOUT;                Surgeon: Medication Radiologist, MD;  Location:              [place];  Service: Radiology;  Laterality: N/A;  BMI    Body Mass Index:  28.86 kg/m      Reproductive/Obstetrics negative OB ROS                           Anesthesia Physical Anesthesia Plan  ASA: IV  Anesthesia Plan: General ETT   Post-op Pain Management:    Induction:   Airway Management Planned:   Additional Equipment:   Intra-op Plan:   Post-operative Plan:   Informed Consent: I have reviewed the patients History and Physical, chart, labs and discussed the procedure including the risks,  benefits and alternatives for the proposed anesthesia with the patient or authorized representative who has indicated his/her understanding and acceptance.     Plan Discussed with: Anesthesiologist, CRNA and Surgeon  Anesthesia Plan Comments: (Son consented as patient is obtunded   Patient and family informed that patient is higher risk for complications from anesthesia during this procedure due to their medical history and age including but not limited to post operative cognitive dysfunction and prolonged intubation.  They voiced understanding.   Due to history of vasogenic edema and brain cancer I feel that GA is safer for this patient then a spinal)       Anesthesia Quick Evaluation

## 2016-02-02 NOTE — ED Provider Notes (Signed)
[facility] Emergency Department Provider Note  ____________________________________________  Time seen: Approximately 8:36 AM  I have reviewed the triage vital signs and the nursing notes.   HISTORY  Chief Complaint Fall    HPI Alexis Buck is a 75 y.o. female with a recent admission for hematemesis associated with high dose steroids for vasogenic edema, who uses a cane for walking, presenting with right ankle instability after fall. The patient reports that she has been doing well without any new symptoms, and today was walking when she developed bilateral leg weakness and "lowered herself to the ground." Her daughter witnessed the episode, the patient did not have any loss of consciousness, nor did she hit her head. The patient denies any associated chest discomfort, shortness of breath, palpitations, numbness tingling or weakness. She does not have a headache, and has not been having any black or tarry-colored looking stools. No recent illness including nausea, vomiting, diarrhea, fever or chills.   Past Medical History:  Diagnosis Date  . Benign neoplasm of breast 2009  . Brain tumor ([place])   . Cancer ([place])   . Hypertension     Patient Active Problem List   Diagnosis Date Noted  . Hematemesis 01/30/2016  . Elevated troponin I level 01/30/2016  . Acute renal failure superimposed on stage 3 chronic kidney disease ([place]) 01/06/2016  . Brain tumor ([place])   . Essential hypertension 01/01/2016  . Lung mass 01/01/2016  . Brain metastasis ([place]) 01/01/2016  . Kidney disease 01/01/2016  . Hypokalemia 01/01/2016  . Hypercalcemia 01/01/2016    Past Surgical History:  Procedure Laterality Date  . BREAST BIOPSY Left 2009  . CESAREAN SECTION    . COLONOSCOPY  2006   Dr.KHAN  . POLYPECTOMY  2006  . RADIOLOGY WITH ANESTHESIA N/A 01/05/2016   Procedure: MRI BRAIN WITH AND WITHOUT;  Surgeon: Medication Radiologist, MD;  Location: [place];  Service: Radiology;   Laterality: N/A;    Current Outpatient Rx  . Order #: [id number] Class: Historical Med  . Order #: [id number] Class: Historical Med  . Order #: [id number] Class: Historical Med  . Order #: [id number] Class: Historical Med  . Order #: [id number] Class: Historical Med  . Order #: [id number] Class: Print  . Order #: [id number] Class: Print  . Order #: [id number] Class: Historical Med  . Order #: [id number] Class: Normal  . Order #: [id number] Class: Print  . Order #: [id number] Class: Historical Med  . Order #: [id number] Class: Historical Med    Allergies Review of patient's allergies indicates no known allergies.  Family History  Problem Relation Age of Onset  . Brain cancer Neg Hx     Social History Social History  Substance Use Topics  . Smoking status: Current Every Day Smoker    Packs/day: 2.00    Years: 60.00  . Smokeless tobacco: Never Used  . Alcohol use Yes    Review of Systems Constitutional: No fever/chills.No lightheadedness. No syncopal BP. No trauma to the head area and positive fall. Eyes: No visual changes. No blurred or double vision. ENT: No sore throat. No congestion or rhinorrhea. Cardiovascular: Denies chest pain. Denies palpitations. Respiratory: Denies shortness of breath.  No cough. Gastrointestinal: No abdominal pain.  No nausea, no vomiting.  No diarrhea.  No constipation. Recent admission for hematemesis in the setting of high-dose steroids. Genitourinary: Negative for dysuria. Musculoskeletal: Negative for back pain. No neck pain. Positive right ankle instability with mild pain. Skin: Negative for rash. Neurological: Negative for headaches. No focal numbness, tingling or weakness. Unable to  walk after fall. No changes in vision or speech. No changes in mental status.  10-point ROS otherwise negative.  ____________________________________________   PHYSICAL EXAM:  VITAL SIGNS: ED Triage Vitals [02/02/16 0828]  Enc Vitals Group     BP 137/71     Pulse Rate  (!) 106     Resp      Temp 97.9 F (36.6 C)     Temp Source Oral     SpO2 99 %     Weight 162 lb 14.4 oz (73.9 kg)     Height '5\' 3"'$  (1.6 m)     Head Circumference      Peak Flow      Pain Score 5     Pain Loc      Pain Edu?      Excl. in [place]?     Constitutional: Alert and oriented. Well appearing and in no acute distress. Answers questions appropriately. Eyes: Conjunctivae are normal.  EOMI. No scleral icterus. Head: Atraumatic.No raccoon eyes. No Battle sign. Nose: No congestion/rhinnorhea. No swelling over the nose. No septal hematoma. Mouth/Throat: Mucous membranes are moist. No dental injury or malocclusion. Neck: No stridor.  Supple.  No JVD. No midline C-spine tenderness to palpation, step-offs or deformities. Cardiovascular: Normal rate, regular rhythm. No murmurs, rubs or gallops.  Respiratory: Normal respiratory effort.  No accessory muscle use or retractions. Lungs CTAB.  No wheezes, rales or ronchi. Gastrointestinal: Obese. Soft, nontender and nondistended.  No guarding or rebound.  No peritoneal signs. Musculoskeletal: No LE edema. Positive ecchymosis on the medial malleolus of the right ankle. Positive complete ankle instability with obvious dislocation of the right ankle. No skin tenting or open fracture. Normal DP and PT pulses in the right foot. Normal sensation to light touch throughout the right lower extremity. Full range of motion of the bilateral hips, bilateral knees, and left ankle, without pain. Pelvis is stable. No midline thoracic or lumbar spine tenderness to palpation, step-offs or deformities. Neurologic:  A&Ox3.  Speech is clear.  Face and smile are symmetric.  EOMI.  Moves all extremities well. Skin:  Skin is warm, dry and intact. No rash noted. Psychiatric: Mood and affect are normal. Speech and behavior are normal.  Normal judgement.  ____________________________________________   LABS (all labs ordered are listed, but only abnormal results are  displayed)  Labs Reviewed  CBC - Abnormal; Notable for the following:       Result Value   WBC 12.1 (*)    RDW 17.0 (*)    All other components within normal limits  BASIC METABOLIC PANEL - Abnormal; Notable for the following:    CO2 20 (*)    Glucose, Bld 141 (*)    BUN 31 (*)    Creatinine, Ser 1.12 (*)    Calcium 8.1 (*)    GFR calc non Af Amer 46 (*)    GFR calc Af Amer 54 (*)    All other components within normal limits   ____________________________________________  EKG  ED ECG REPORT I, Eula Listen, the attending physician, personally viewed and interpreted this ECG.   Date: 02/02/2016  EKG Time: 811  Rate: 110  Rhythm: sinus tachycardia with PVC  Axis: Leftward  Intervals:none  ST&T Change: No ST elevation.  ____________________________________________  RADIOLOGY  Dg Chest 1 View  Result Date: 02/02/2016 CLINICAL DATA:  Recent fall, un stable right ankle EXAM: CHEST 1 VIEW COMPARISON:  Chest x-ray of 01/30/2016 and CT chest of 01/01/2016 FINDINGS: The lung  mass overlying the left aortopulmonary window is not as well seen as on prior chest x-ray and CT of the chest consistent with primary lung carcinoma. Linear atelectasis or scarring remains in the left mid lung with atelectasis at both lung bases. Small pleural effusions cannot be excluded. Calcified right thyroid lesion is unchanged. No pneumothorax is seen. Cardiomegaly is stable. IMPRESSION: 1. The left aortopulmonary window lung mass is not as well seen on the current chest x-ray. 2. Bibasilar linear atelectasis. Linear atelectasis or scarring remains in the left mid lung as well. Electronically Signed   By: Ivar Drape M.D.   On: 02/02/2016 09:16   Dg Ankle Complete Right  Result Date: 02/02/2016 CLINICAL DATA:  [place] today EXAM: RIGHT ANKLE - COMPLETE 3+ VIEW COMPARISON:  None. FINDINGS: There is a displaced fracture of the medial malleolus with the distal fragment displaced medially. Also there is  tibiotalar subluxation with the talus lateral relative to the distal right tibia. An oblique displaced fracture of the distal fibula also is noted. On the lateral view the posterior malleolus appears intact. A degenerative calcaneal spur is noted. IMPRESSION: 1. Bimalleolar right ankle fractures involving the medial and lateral malleoli. 2. Tibiotalar subluxation. Electronically Signed   By: Ivar Drape M.D.   On: 02/02/2016 09:14    ____________________________________________   PROCEDURES  Procedure(s) performed: Yes, splint  Procedures  Critical Care performed: No ____________________________________________   INITIAL IMPRESSION / ASSESSMENT AND PLAN / ED COURSE  Pertinent labs & imaging results that were available during my care of the patient were reviewed by me and considered in my medical decision making (see chart for details).  75 y.o. female presenting with a mechanical fall resulting in right ankle dislocation and possible fracture. At this time, I do not see any evidence of any additional intracranial, spine, or thoracic or abdominal injury. The patient has a stable pelvis. We'll get an x-ray of the ankle, and basic labs. I will immediately put a sugar tong and posterior splint on the ankle to maintain stabilization as the patient is neurovascularly intact at this time. Initially, the patient is deferring pain medication, but now is agreeing to fentanyl.  SPLINT APPLICATION Date/Time: 81:19 AM Authorized by: Eula Listen Consent: Verbal consent obtained. Risks and benefits: risks, benefits and alternatives were discussed Consent given by: patient Splint applied by: ER technician Location details: Right ankle Splint type: Sugartong and Posterior splint Supplies used: Orthoglass Post-procedure: The splinted body part was neurovascularly unchanged following the procedure. Patient tolerance: Patient tolerated the procedure well with no immediate complications.  DP  pulse preserved after splinting.   ----------------------------------------- 10:04 AM on 02/02/2016 -----------------------------------------  The patient has remained comfortably and hemodynamically stable. She does have some mild renal insufficiency, and I'm treating her with IV fluids. Her ankle x-ray shows a bimalleolar fracture with the talar tibial subluxation. I've spoken with Dr. Rudene Christians, who will plan on surgery today. Plan admission.  ____________________________________________  FINAL CLINICAL IMPRESSION(S) / ED DIAGNOSES  Final diagnoses:  Bimalleolar fracture of right ankle, closed, initial encounter  Fall, initial encounter  Subluxation of ankle joint, right, initial encounter  Renal insufficiency    Clinical Course      NEW MEDICATIONS STARTED DURING THIS VISIT:  New Prescriptions   No medications on file      Eula Listen, MD 02/02/16 1004

## 2016-02-02 NOTE — ED Triage Notes (Signed)
Pt arrived by EMS from home. Pt was ambulating to bathroom this AM and fell to the floor. Family witnessed fall and reported "her knees went sideways and then she lowered herself to the ground" Pt c/o R ankle and R shoulder pain. Pt was recently diagnosed with a brain tumor and has an appointment this afternoon to start the process for radiation. Pt is verbal answering questions. Family reports patients "baseline now is confusion due to tumor and steriods."

## 2016-02-02 NOTE — Progress Notes (Signed)
Patient is very sleepy and wakes up to voice. Right leg with cast in place and is elevated on pillows. CMTS WNL. C/O no pain. BP low since surgery, but is getting better (104/75). On 3L O2, sats 97%. Bed alarm on for safety. Unable to give meds d/t LOC.

## 2016-02-02 NOTE — NC FL2 (Signed)
[place] LEVEL OF CARE SCREENING TOOL     IDENTIFICATION  Patient Name: Alexis Buck Birthdate: December 26, 1940 Sex: female Admission Date (Current Location): 02/02/2016  [facility] and [place] Number:  Alexis Buck  ([id number]) Facility and Address:  [facility], [address], [place], [place] [address]      Provider Number: [id number]  Attending Physician Name and Address:  Hessie Knows, MD  Relative Name and Phone Number:       Current Level of Care: Hospital Recommended Level of Care: [place] Prior Approval Number:    Date Approved/Denied:   PASRR Number:  [id number])  Discharge Plan: SNF    Current Diagnoses: Patient Active Problem List   Diagnosis Date Noted  . Displaced bimalleolar fracture of right ankle 02/02/2016  . Hematemesis 01/30/2016  . Elevated troponin I level 01/30/2016  . Acute renal failure superimposed on stage 3 chronic kidney disease ([place]) 01/06/2016  . Brain tumor ([place])   . Essential hypertension 01/01/2016  . Lung mass 01/01/2016  . Brain metastasis ([place]) 01/01/2016  . Kidney disease 01/01/2016  . Hypokalemia 01/01/2016  . Hypercalcemia 01/01/2016    Orientation RESPIRATION BLADDER Height & Weight     Self  Normal Incontinent Weight: 162 lb (73.5 kg) Height:  '5\' 3"'$  (160 cm)  BEHAVIORAL SYMPTOMS/MOOD NEUROLOGICAL BOWEL NUTRITION STATUS   (none)  (none) Continent Diet (Diet: NPO for surgery )  AMBULATORY STATUS COMMUNICATION OF NEEDS Skin   Extensive Assist Verbally Surgical wounds                       Personal Care Assistance Level of Assistance  Bathing, Feeding, Dressing Bathing Assistance: Limited assistance Feeding assistance: Independent Dressing Assistance: Limited assistance     Functional Limitations Info  Sight, Hearing, Speech Sight Info: Impaired Hearing Info: Adequate Speech Info: Adequate    SPECIAL CARE FACTORS FREQUENCY  PT (By licensed PT), OT (By  licensed OT)     PT Frequency:  (5) OT Frequency:  (5)            Contractures      Additional Factors Info  Code Status, Allergies Code Status Info:  (Unknown ) Allergies Info:  (No Known Allergies. )           Current Medications (02/02/2016):  This is the current hospital active medication list Current Facility-Administered Medications  Medication Dose Route Frequency Provider Last Rate Last Dose  . carvedilol (COREG) 12.5 MG tablet           . lactated ringers infusion   Intravenous Continuous Andria Frames, MD 50 mL/hr at 02/02/16 1340    . [MAR Hold] morphine 2 MG/ML injection 2 mg  2 mg Intravenous Q1H PRN Hessie Knows, MD      . neomycin-polymyxin B (NEOSPORIN) irrigation solution    PRN Hessie Knows, MD   4 mL at 02/02/16 1426  . sodium chloride flush 0.9 % injection            Facility-Administered Medications Ordered in Other Encounters  Medication Dose Route Frequency Provider Last Rate Last Dose  . ePHEDrine injection   Intravenous Anesthesia Intra-op Rolla Plate, CRNA   5 mg at 02/02/16 1502  . fentaNYL (SUBLIMAZE) injection    Anesthesia Intra-op Rolla Plate, CRNA   50 mcg at 02/02/16 1447  . glycopyrrolate (ROBINUL) injection    Anesthesia Intra-op Rolla Plate, CRNA   0.2 mg at 02/02/16 1442  . ketamine (KETALAR) injection  Anesthesia Intra-op Rolla Plate, CRNA   25 mg at 02/02/16 1445  . lidocaine (cardiac) 100 mg/46m (XYLOCAINE) 20 MG/ML injection 2%   Intravenous Anesthesia Intra-op BRolla Plate CRNA   50 mg at 02/02/16 1429  . phenylephrine (NEO-SYNEPHRINE) injection   Intravenous Anesthesia Intra-op BRolla Plate CRNA   200 mcg at 02/02/16 1501  . propofol (DIPRIVAN) 10 mg/mL bolus/IV push    Anesthesia Intra-op BRolla Plate CRNA   130 mg at 02/02/16 1429  . succinylcholine (ANECTINE) injection   Intravenous Anesthesia Intra-op BRolla Plate CRNA   100 mg at 02/02/16 1429  . vasopressin (PITRESSIN) 20 UNIT/ML injection     Anesthesia Intra-op BRolla Plate CRNA   2 Units at 02/02/16 1440     Discharge Medications: Please see discharge summary for a list of discharge medications.  Relevant Imaging Results:  Relevant Lab Results:   Additional Information  (SSN: [id number]  Alexis Buck, BVeronia Beets LCSW

## 2016-02-02 NOTE — Anesthesia Postprocedure Evaluation (Signed)
Anesthesia Post Note  Patient: Adeleigh Barletta Nester  Procedure(s) Performed: Procedure(s) (LRB): OPEN REDUCTION INTERNAL FIXATION (ORIF) DISTAL ANKLE FRACTURE WITH SENODESMOSIS REPAIR (Right)  Patient location during evaluation: Endoscopy Anesthesia Type: General Level of consciousness: awake and alert Pain management: pain level controlled Vital Signs Assessment: post-procedure vital signs reviewed and stable Respiratory status: spontaneous breathing, nonlabored ventilation, respiratory function stable and patient connected to nasal cannula oxygen Cardiovascular status: blood pressure returned to baseline and stable Postop Assessment: no signs of nausea or vomiting Anesthetic complications: no    Last Vitals:  Vitals:   02/02/16 1652 02/02/16 1720  BP: 98/63 99/64  Pulse: 92 85  Resp: 19 18  Temp: 36.4 C 36.4 C    Last Pain:  Vitals:   02/02/16 1720  TempSrc: Oral  PainSc: [place] S

## 2016-02-02 NOTE — Transfer of Care (Signed)
Immediate Anesthesia Transfer of Care Note  Patient: Alexis Buck  Procedure(s) Performed: Procedure(s): OPEN REDUCTION INTERNAL FIXATION (ORIF) DISTAL ANKLE FRACTURE WITH SENODESMOSIS REPAIR (Right)  Patient Location: PACU  Anesthesia Type:General  Level of Consciousness: awake  Airway & Oxygen Therapy: Patient Spontanous Breathing and Patient connected to face mask oxygen  Post-op Assessment: Report given to RN and Post -op Vital signs reviewed and stable  Post vital signs: Reviewed  Last Vitals:  Vitals:   02/02/16 1322 02/02/16 1537  BP: (!) 127/101 92/73  Pulse: 99   Resp: 14 17  Temp: 36.2 C     Last Pain:  Vitals:   02/02/16 1322  TempSrc: Tympanic  PainSc: 7          Complications: No apparent anesthesia complications

## 2016-02-02 NOTE — Anesthesia Procedure Notes (Signed)
Procedure Name: Intubation Performed by: Rolla Plate Pre-anesthesia Checklist: Patient identified, Patient being monitored, Timeout performed, Emergency Drugs available and Suction available Patient Re-evaluated:Patient Re-evaluated prior to inductionOxygen Delivery Method: Circle system utilized Preoxygenation: Pre-oxygenation with 100% oxygen Intubation Type: IV induction and Rapid sequence Laryngoscope Size: Miller and 2 Grade View: Grade I Tube type: Oral Tube size: 7.0 mm Number of attempts: 1 Airway Equipment and Method: Stylet Placement Confirmation: ETT inserted through vocal cords under direct vision,  positive ETCO2 and breath sounds checked- equal and bilateral Secured at: 19 cm Tube secured with: Tape Dental Injury: Teeth and Oropharynx as per pre-operative assessment

## 2016-02-02 NOTE — Progress Notes (Signed)
Patient admitted to room 151 from ER via cart and ER staff. Alert to self only. ACE wrap to Right ankle. NSL in RFA. Bed alarm in place for safety. CHG bath performed. Family at bedside.

## 2016-02-02 NOTE — ED Notes (Signed)
Patient transported to X-ray 

## 2016-02-02 NOTE — H&P (Signed)
Subjective:   Patient is a 75 y.o. female presents with Right ankle pain and deformity. Onset of symptoms was abrupt starting 5 hours ago with unchanged course since that time. The pain is located all around the right ankle. Patient describes the pain as dull continuous and rated as moderate. Pain has been associated with a fall she thinks her ankle rolled under. Patient denies loss of consciousness. Symptoms are aggravated by weightbearing. Symptoms improve with reduction in ER. Past history includes recent diagnosis of tumor with brain metastasis and was to start radiation therapy today.  Previous studies include x-rays in ER showing a displaced bimalleolar ankle fracture.  Patient Active Problem List   Diagnosis Date Noted  . Displaced bimalleolar fracture of right ankle 02/02/2016  . Hematemesis 01/30/2016  . Elevated troponin I level 01/30/2016  . Acute renal failure superimposed on stage 3 chronic kidney disease ([place]) 01/06/2016  . Brain tumor ([place])   . Essential hypertension 01/01/2016  . Lung mass 01/01/2016  . Brain metastasis ([place]) 01/01/2016  . Kidney disease 01/01/2016  . Hypokalemia 01/01/2016  . Hypercalcemia 01/01/2016   Past Medical History:  Diagnosis Date  . Benign neoplasm of breast 2009  . Brain tumor ([place])   . Cancer ([place])   . Hypertension     Past Surgical History:  Procedure Laterality Date  . BREAST BIOPSY Left 2009  . CESAREAN SECTION    . COLONOSCOPY  2006   Dr.KHAN  . POLYPECTOMY  2006  . RADIOLOGY WITH ANESTHESIA N/A 01/05/2016   Procedure: MRI BRAIN WITH AND WITHOUT;  Surgeon: Medication Radiologist, MD;  Location: [facility];  Service: Radiology;  Laterality: N/A;    Prescriptions Prior to Admission  Medication Sig Dispense Refill Last Dose  . aspirin EC 81 MG tablet Take 81 mg by mouth daily.   02/01/2016 at 0600  . busPIRone (BUSPAR) 7.5 MG tablet Take 7.5 mg by mouth 2 (two) times daily.   02/01/2016 at 0600  . calcium carbonate (TUMS - DOSED IN MG  ELEMENTAL CALCIUM) 500 MG chewable tablet Chew 2 tablets by mouth every 4 (four) hours as needed for indigestion or heartburn.   prn at prn  . carvedilol (COREG) 12.5 MG tablet Take 12.5 mg by mouth 2 (two) times daily.   02/01/2016 at 1000  . citalopram (CELEXA) 40 MG tablet Take 40 mg by mouth daily.   02/01/2016 at 0600  . dexamethasone (DECADRON) 4 MG tablet Take 1 tablet (4 mg total) by mouth every 8 (eight) hours. 90 tablet 0 02/02/2016 at 0600  . HYDROcodone-acetaminophen (NORCO/VICODIN) 5-325 MG tablet Take 1-2 tablets by mouth every 4 (four) hours as needed for moderate pain. 30 tablet 0 Past Month at Unknown time  . Multiple Vitamin (MULTIVITAMIN WITH MINERALS) TABS tablet Take 1 tablet by mouth daily.   02/01/2016 at 0600  . pantoprazole (PROTONIX) 40 MG tablet '40mg'$  BID for 2 weeks, then '40mg'$  daily 60 tablet 0 02/01/2016 at 1000  . QUEtiapine (SEROQUEL) 50 MG tablet Take 1 tablet (50 mg total) by mouth at bedtime. (Patient taking differently: Take 50 mg by mouth 2 (two) times daily. 1600 and 2100) 30 tablet 0 02/01/2016 at 1600  . risperiDONE (RISPERDAL) 0.5 MG tablet Take 0.5 mg by mouth at bedtime.   02/01/2016 at 2200  . simvastatin (ZOCOR) 20 MG tablet Take 20 mg by mouth every evening.   02/01/2016 at 2200   No Known Allergies  Social History  Substance Use Topics  . Smoking status: Current  Every Day Smoker    Packs/day: 2.00    Years: 60.00  . Smokeless tobacco: Never Used  . Alcohol use Yes    Family History  Problem Relation Age of Onset  . Brain cancer Neg Hx     Review of Systems Pertinent items are noted in HPI.  Objective:   Patient Vitals for the past 8 hrs:  BP Temp Temp src Pulse Resp SpO2 Height Weight  02/02/16 1133 (!) 146/78 98.2 F (36.8 C) Oral 99 18 98 % - -  02/02/16 0913 - - - 100 19 97 % - -  02/02/16 0911 125/64 - - - - - - -  02/02/16 0830 114/70 - - - (!) 26 - - -  02/02/16 0828 137/71 97.9 F (36.6 C) Oral (!) 106 - 99 % '5\' 3"'$  (1.6 m) 73.9 kg (162  lb 14.4 oz)   No intake/output data recorded. No intake/output data recorded.    BP (!) 146/78 (BP Location: Left Arm)   Pulse 99   Temp 98.2 F (36.8 C) (Oral)   Resp 18   Ht '5\' 3"'$  (1.6 m)   Wt 73.9 kg (162 lb 14.4 oz)   LMP  (LMP Unknown)   SpO2 98%   BMI 28.86 kg/m  General appearance: alert, cooperative, appears stated age, fatigued and moderate distress Lungs: clear to auscultation bilaterally Heart: regular rate and rhythm, S1, S2 normal, no murmur, click, rub or gallop Extremities: Right ankle is in splint with her able to flex extend toes and brisk capillary refill. Diminished sensation to light touch HEENT is remarkable for edentulous    Data ReviewRadiology review: AP lateral mortise view shows displaced bimalleolar ankle fracture  Assessment:   Active Problems:   Displaced bimalleolar fracture of right ankle   Plan:   ORIF of ankle, we will also inform Dr. Baruch Gouty of patient's admission

## 2016-02-03 ENCOUNTER — Encounter: Payer: Self-pay | Admitting: Orthopedic Surgery

## 2016-02-03 ENCOUNTER — Inpatient Hospital Stay: Payer: Medicare Other

## 2016-02-03 LAB — BRAIN NATRIURETIC PEPTIDE: B Natriuretic Peptide: 2052 pg/mL — ABNORMAL HIGH (ref 0.0–100.0)

## 2016-02-03 MED ORDER — FUROSEMIDE 40 MG PO TABS
40.0000 mg | ORAL_TABLET | Freq: Two times a day (BID) | ORAL | Status: DC
  Administered 2016-02-03 – 2016-02-05 (×4): 40 mg via ORAL
  Filled 2016-02-03 (×4): qty 1

## 2016-02-03 MED ORDER — FUROSEMIDE 10 MG/ML IJ SOLN
20.0000 mg | Freq: Once | INTRAMUSCULAR | Status: AC
  Administered 2016-02-03: 20 mg via INTRAVENOUS
  Filled 2016-02-03: qty 2

## 2016-02-03 MED ORDER — IPRATROPIUM-ALBUTEROL 0.5-2.5 (3) MG/3ML IN SOLN: 3.0000 mL | RESPIRATORY_TRACT | Status: DC | PRN

## 2016-02-03 NOTE — Care Management (Signed)
Patient suffers from fractured ankle which impairs their ability to perform daily activities like ambulating safely  in the home.  A walker or cane will not resolve issue with performing activities of daily living. A wheelchair will allow patient to safely perform daily activities. Patient can safely propel the wheelchair in the home or has a caregiver who can provide assistance.

## 2016-02-03 NOTE — Clinical Social Work Note (Signed)
Clinical Social Work Assessment  Patient Details  Name: Alexis Buck MRN: [id number] Date of Birth: [birth year]  Date of referral:  02/03/16               Reason for consult:  Facility Placement                Permission sought to share information with:  Chartered certified accountant granted to share information::  Yes, Verbal Permission Granted  Name::      [place]::     Relationship::     Contact Information:     Housing/Transportation Living arrangements for the past 2 months:  [place], [place] of Information:  Patient, Adult Children Patient Interpreter Needed:  None Criminal Activity/Legal Involvement Pertinent to Current Situation/Hospitalization:  No - Comment as needed Significant Relationships:  Adult Children Lives with:  Adult Children Do you feel safe going back to the place where you live?  Yes Need for family participation in patient care:  Yes (Comment)  Care giving concerns:  Patient lives with her daughter Alexis Buck in [place].    Social Worker assessment / plan:  Holiday representative (CSW) received verbal consult from PT that recommendation is SNF. Patient was recently discharged [facility] last week. Per [facility]is [facility] liaision patient's daughter decided to take her out of [facility] because of copays. Per Alexis Buck he would accept patient back at [facility]. CSW met with patient alone at bedside to discuss D/C plan. Patient reported that she lives with her daughter Alexis Buck. CSW discussed SNF placement. Patient reported that CSW would have to speak to her daughter or son about placement and discharge planning. CSW contacted patient's daughter Alexis Buck. Per daughter patient has 24/7 supervision at home. Per daughter she would prefer for patient to come home from [facility] and resume home health services with [facility]. Daughter also requested a wheel chair. Daughter is agreeable to SNF as a back up plan.  CSW explained to daughter that patient will be non-weight bearing and is max assist. Daughter reported that she still prefers to take her home. Per daughter she would like for patient to start radiation for her brain tumor soon and reported that family care provide transport to the cancer center.   FL2 complete. CSW presented bed offers to daughter. Per daughter she would consider [facility] but still prefers home. [facility] liaison is aware of above. CSW will continue to follow and assist as needed.     Employment status:  Disabled (Comment on whether or not currently receiving Disability), Retired Nurse, adult PT Recommendations:  [place] / Referral to community resources:  [place]  Patient/Family's Response to care:  Patient's daughter prefers to take patient home.   Patient/Family's Understanding of and Emotional Response to Diagnosis, Current Treatment, and Prognosis: Patient and her daughter were pleasant and thanked CSW for assistance.   Emotional Assessment Appearance:  Appears stated age Attitude/Demeanor/Rapport:    Affect (typically observed):  Accepting, Adaptable, Pleasant Orientation:  Oriented to Self, Oriented to Place, Oriented to  Time, Oriented to Situation Alcohol / Substance use:  Not Applicable Psych involvement (Current and /or in the community):  No (Comment)  Discharge Needs  Concerns to be addressed:  Discharge Planning Concerns Readmission within the last 30 days:  No Current discharge risk:  Dependent with Mobility Barriers to Discharge:  Continued Medical Work up   [employer], [place] Beets, LCSW 02/03/2016,  12:18 PM

## 2016-02-03 NOTE — Progress Notes (Signed)
SNF and Non-Emergent EMS Transport Benefits:  Number called: [phone] Rep: Langley Gauss Reference Number: [id number]  [place] Medicare Complete HMO Plan Two active as of 05/24/15 with no deductible.  Out of pocket max is $6700, of which $408.56 met so far.  In-network SNF: $0 copay for days 1-20, a $160 daily copay for days 21-62, and a $0 copay for days 63-100.  Once out of pocket is reached, patient covered at 100% for remainder of 100 day benefit period. Per rep, patient has used 13 days so far from 01/09/16 - 01/21/16.  $0 copay for professional fees and 3 day hospital stay is not required.  Josem Kaufmann is required: [phone].    Non-emergent EMS transport: $250 copay for each one way medically necessary, Medicare covered trip.  Josem Kaufmann is required: [phone].

## 2016-02-03 NOTE — Progress Notes (Signed)
   Subjective: 1 Day Post-Op Procedure(s) (LRB): OPEN REDUCTION INTERNAL FIXATION (ORIF) DISTAL ANKLE FRACTURE WITH SENODESMOSIS REPAIR (Right) Patient reports pain as 5 on 0-10 scale.   Patient is well, and has had no acute complaints or problems Denies any CP, SOB, ABD pain. We will continue therapy today.  Plan is to go Skilled nursing facility after hospital stay.  Objective: Vital signs in last 24 hours: Temp:  [97.1 F (36.2 C)-98.2 F (36.8 C)] 97.6 F (36.4 C) (09/13 0338) Pulse Rate:  [80-106] 83 (09/13 0338) Resp:  [14-26] 19 (09/13 0338) BP: (92-146)/(63-101) 116/90 (09/13 0338) SpO2:  [87 %-100 %] 100 % (09/13 0338) Weight:  [73.5 kg (162 lb)-73.9 kg (162 lb 14.4 oz)] 73.5 kg (162 lb) (09/12 1322)  Intake/Output from previous day: 09/12 0701 - 09/13 0700 In: 1988.8 [I.V.:1788.8; IV Piggyback:200] Out: -  Intake/Output this shift: No intake/output data recorded.   Recent Labs  02/02/16 0834  HGB 13.0    Recent Labs  02/02/16 0834  WBC 12.1*  RBC 4.14  HCT 37.9  PLT 192    Recent Labs  02/02/16 0834  NA 136  K 4.0  CL 108  CO2 20*  BUN 31*  CREATININE 1.12*  GLUCOSE 141*  CALCIUM 8.1*   No results for input(s): LABPT, INR in the last 72 hours.  EXAM General - Patient is Alert, Appropriate and Oriented Extremity - Neurovascular intact Sensation intact distally Intact pulses distally Compartment soft short leg cast intact Dressing - dressing C/D/I and no drainage Motor Function - intact, moving toes well on exam.   Past Medical History:  Diagnosis Date  . Benign neoplasm of breast 2009  . Brain tumor ([place])   . Cancer ([place])   . Hypertension     Assessment/Plan:   1 Day Post-Op Procedure(s) (LRB): OPEN REDUCTION INTERNAL FIXATION (ORIF) DISTAL ANKLE FRACTURE WITH SENODESMOSIS REPAIR (Right) Active Problems:   Displaced bimalleolar fracture of right ankle  Estimated body mass index is 28.7 kg/m as calculated from the  following:   Height as of this encounter: '5\' 3"'$  (1.6 m).   Weight as of this encounter: 73.5 kg (162 lb). Advance diet Up with therapy, toe touch weight bearing right lower extremity CM to assist with discharge Follow up with [place] [facility] in 2 weeks for staple removal   DVT Prophylaxis - Lovenox, Foot Pumps and TED hose    T. Rachelle Hora, PA-C [place] 02/03/2016, 7:56 AM

## 2016-02-03 NOTE — Progress Notes (Signed)
Physical Therapy Treatment Patient Details Name: Alexis Buck MRN: [id number] DOB: Sep 30, 1940 Today's Date: 02/03/2016    History of Present Illness Pt is a 75 y.o. female presents with Right ankle pain and deformity. Onset of symptoms was abrupt starting 5 hours ago with unchanged course since that time. The pain is located all around the right ankle. Patient describes the pain as dull continuous and rated as moderate. Pain has been associated with a fall she thinks her ankle rolled under. Patient denies loss of consciousness. Symptoms are aggravated by weightbearing. Symptoms improve with reduction in ER. Past history includes recent diagnosis of tumor with brain metastasis and was to start radiation therapy today.  Previous studies include x-rays in ER showing a displaced bimalleolar ankle fracture. Pt is s/p R ankle ORIF.     PT Comments    Pt presents with deficits in strength, gait, mobility, transfers, balance, and activity tolerance and is unable to stand/amb at this time secondary to weakness and RLE WB status.  Therex and mobility training and HEP education/review provided per below.  Pt will benefit from continued PT services to address the above deficits for decreased caregiver assistance upon discharge.   Follow Up Recommendations  SNF     Equipment Recommendations  Rolling walker with 5" wheels    Recommendations for Other Services       Precautions / Restrictions Precautions Precautions: Fall Restrictions Weight Bearing Restrictions: Yes RLE Weight Bearing: Touchdown weight bearing    Mobility  Bed Mobility Overal bed mobility: Needs Assistance Bed Mobility: Rolling;Supine to Sit;Sit to Supine Rolling: Min assist   Supine to sit: Min assist Sit to supine: Mod assist   General bed mobility comments: Min verbal cues for sequencing using log roll technique  Transfers                    Ambulation/Gait                 Stairs             Wheelchair Mobility    Modified Rankin (Stroke Patients Only)       Balance Overall balance assessment: Needs assistance   Sitting balance-Leahy Scale: Fair                              Cognition Arousal/Alertness: Awake/alert Behavior During Therapy: WFL for tasks assessed/performed Overall Cognitive Status: Within Functional Limits for tasks assessed                      Exercises Total Joint Exercises Ankle Circles/Pumps: Strengthening;Left;10 reps;15 reps Quad Sets: AROM;Both;10 reps;15 reps Gluteal Sets: AROM;10 reps Short Arc Quad: Strengthening;Both;10 reps;15 reps Heel Slides: AAROM;Both;10 reps;15 reps Hip ABduction/ADduction: Strengthening;Both;10 reps;15 reps Straight Leg Raises: AAROM;Both;10 reps;15 reps Long Arc Quad: Strengthening;Both;10 reps;15 reps Knee Flexion: Strengthening;Both;10 reps;15 reps Other Exercises Other Exercises: Seated balance/core therex without UE support Other Exercises: Supine leg press with manual resistance LLE 2 x 10 Other Exercises: HEP education/review with L APs, B QS, and B GS x 10 reps 3-4x/day    General Comments        Pertinent Vitals/Pain Pain Assessment: No/denies pain    Home Living                      Prior Function            PT Goals (current goals can  now be found in the care plan section)      Frequency  BID    PT Plan Current plan remains appropriate    Co-evaluation             End of Session Equipment Utilized During Treatment: Oxygen Activity Tolerance: Patient tolerated treatment well Patient left: in bed;with call bell/phone within reach;with nursing/sitter in room;with bed alarm set     Time: 5300-5110 PT Time Calculation (min) (ACUTE ONLY): 41 min  Charges:  $Therapeutic Exercise: 23-37 mins $Therapeutic Activity: 8-22 mins                    G Codes:      [name] 02/03/2016, 3:40 PM

## 2016-02-03 NOTE — Progress Notes (Signed)
Pt with several episodes of hypoxia this shift, O2 @ 3L however pt found to be with POX of 70's-80's. Improved with deep breathing and incentive spirometer while nursing staff in room. MD notified, hospitalist paged. CXR and ABG ordered. Pt positive for fluid in base of bilateral lungs, lasix administered. Pt denies any SOB or discomfort during these episodes, states she feels fie. Respirations WNL. Pt quit smoking about 3 weeks ago.Will continue to monitor, education reinforced with incentive spirometer.

## 2016-02-03 NOTE — Progress Notes (Signed)
Please note this patient had a HOME PALLIATIVE consult through [place] in place prior to this admission. This consult was unable to be completed d/t [facility] admission. CSW [employer] and [place] both made aware. Thank you. Flo Shanks RN, BSN, [facility] [place], [facility] [phone] c

## 2016-02-03 NOTE — Evaluation (Signed)
Physical Therapy Evaluation Patient Details Name: Alexis Buck MRN: [id number] DOB: 1941/04/05 Today's Date: 02/03/2016   History of Present Illness  Pt is a 75 y.o. female presents with Right ankle pain and deformity. Onset of symptoms was abrupt starting 5 hours ago with unchanged course since that time. The pain is located all around the right ankle. Patient describes the pain as dull continuous and rated as moderate. Pain has been associated with a fall she thinks her ankle rolled under. Patient denies loss of consciousness. Symptoms are aggravated by weightbearing. Symptoms improve with reduction in ER. Past history includes recent diagnosis of tumor with brain metastasis and was to start radiation therapy today.  Previous studies include x-rays in ER showing a displaced bimalleolar ankle fracture. Pt is s/p R ankle ORIF.   Clinical Impression  Pt presents with deficits in strength, gait, mobility, transfers, balance, and activity tolerance.  Pt requires up to mod A with bed mobility and is unable to perform transfers or ambulate at this time secondary to weakness and inability to maintain RLE WBS. Pt will benefit from PT services to address these issues for decreased caregiver assistance upon discharge.     Follow Up Recommendations SNF    Equipment Recommendations  Rolling walker with 5" wheels    Recommendations for Other Services       Precautions / Restrictions Precautions Precautions: Fall Restrictions Weight Bearing Restrictions: Yes RLE Weight Bearing: Touchdown weight bearing      Mobility  Bed Mobility Overal bed mobility: Needs Assistance Bed Mobility: Supine to Sit;Sit to Supine;Rolling Rolling: Min assist   Supine to sit: Min assist Sit to supine: Mod assist   General bed mobility comments: Bed mobility training with Mod verbal cues for sequencing using log roll technique.  Transfers Overall transfer level: Needs assistance Equipment used: Rolling walker (2  wheeled) Transfers: Sit to/from Stand Sit to Stand: Total assist         General transfer comment: Multiple sit to stand transfer attempts at EOB with pt's R foot on top of this PT's foot and max verbal and tactile cues for sequencing.  During each attempt pt began to push down through RLE and attempt had to be stopped.  Ambulation/Gait Ambulation/Gait assistance:  (Non-ambulatory)              Stairs            Wheelchair Mobility    Modified Rankin (Stroke Patients Only)       Balance Overall balance assessment: Needs assistance   Sitting balance-Leahy Scale: Fair     Standing balance support:  (Unable to assess secondary to WB status)                                 Pertinent Vitals/Pain Pain Assessment: No/denies pain    Home Living Family/patient expects to be discharged to:: Private residence Living Arrangements: Children Available Help at Discharge: Family;Other (Comment) (Pt lives with daughter who works during the day) Type of Home: [place] Access: Level entry (First floor apartment with level entry per pt)     Home Layout: One level [place]: [place] - single point      Prior Function Level of Independence: Independent         Comments: Pt reports being ind with amb without AD community distances and ind with ADLs prior to fall     Hand Dominance   Dominant  Hand: Right    Extremity/Trunk Assessment   Upper Extremity Assessment: Overall WFL for tasks assessed           Lower Extremity Assessment: Generalized weakness         Communication   Communication: No difficulties  Cognition Arousal/Alertness: Awake/alert Behavior During Therapy: WFL for tasks assessed/performed Overall Cognitive Status: No family/caregiver present to determine baseline cognitive functioning (Pt presents with some confusion but able to follow commands well)                      General Comments      Exercises  Total Joint Exercises Ankle Circles/Pumps: AROM;Left;10 reps;15 reps Quad Sets: AROM;Both;10 reps;15 reps Gluteal Sets: AROM;10 reps Hip ABduction/ADduction: AAROM;Both;10 reps Straight Leg Raises: AAROM;Both;10 reps Long Arc Quad: AROM;Both;10 reps;15 reps Knee Flexion: AROM;Both;10 reps;15 reps Other Exercises Other Exercises: Seated balance/core therex without UE support      Assessment/Plan    PT Assessment Patient needs continued PT services  PT Diagnosis Difficulty walking;Generalized weakness   PT Problem List Decreased strength;Decreased balance;Decreased mobility  PT Treatment Interventions DME instruction;Gait training;Stair training;Functional mobility training;Therapeutic activities;Therapeutic exercise;Balance training;Neuromuscular re-education;Patient/family education;Wheelchair mobility training   PT Goals (Current goals can be found in the Care Plan section) Acute Rehab PT Goals Patient Stated Goal: To be able to do things by myself PT Goal Formulation: With patient Time For Goal Achievement: 02/16/16 Potential to Achieve Goals: Fair    Frequency BID   Barriers to discharge Decreased caregiver support      Co-evaluation               End of Session Equipment Utilized During Treatment: Gait belt;Oxygen Activity Tolerance: Patient tolerated treatment well Patient left: in bed;with call bell/phone within reach;with SCD's reapplied;with bed alarm set Nurse Communication: Mobility status         Time: 5927-6394 PT Time Calculation (min) (ACUTE ONLY): 35 min   Charges:     PT Treatments $Therapeutic Exercise: 8-22 mins $Therapeutic Activity: 8-22 mins   PT G Codes:        DRoyetta Asal PT, DPT 02/03/16, 11:33 AM

## 2016-02-03 NOTE — Consult Note (Addendum)
[facility] [place] NAME: Alexis Buck    MR#:  [id number]  DATE OF BIRTH:  [birth year]  DATE OF ADMISSION:  02/02/2016  PRIMARY CARE PHYSICIAN: Lavera Guise, MD   CONSULT REQUESTING/REFERRING PHYSICIAN: Dr. Rudene Christians  REASON FOR CONSULT: Hypoxia  CHIEF COMPLAINT:   Chief Complaint  Patient presents with  . Fall   HISTORY OF PRESENT ILLNESS:  75 year-old patient with lung cancer with brain metastases, hypertension had a recent fall and presented to the hospital with right ankle bimalleolar fracture. Patient today was noticed to be hypoxic with saturations into the 70s and placed on 3 L oxygen. In spite of this saturations have dropped to 88% and hospitalist team has been consulted. She has no chest pain or cough. Not on oxygen at home. Has not smoked in 3 weeks. Was recently in the hospital for hematemesis due to high-dose steroids for brain metastases. This has resolved.  Check stat ABG and CXR. that showed low pO2 at 49, normal pH and PCO2. Chest x-ray shows vascular congestion.  PAST MEDICAL HISTORY:   Past Medical History:  Diagnosis Date  . Benign neoplasm of breast 2009  . Brain tumor ([place])   . Cancer ([place])   . Hypertension     PAST SURGICAL HISTOIRY:   Past Surgical History:  Procedure Laterality Date  . BREAST BIOPSY Left 2009  . CESAREAN SECTION    . COLONOSCOPY  2006   Dr.KHAN  . ORIF ANKLE FRACTURE Right 02/02/2016   Procedure: OPEN REDUCTION INTERNAL FIXATION (ORIF) DISTAL ANKLE FRACTURE WITH SENODESMOSIS REPAIR;  Surgeon: Hessie Knows, MD;  Location: [facility];  Service: Orthopedics;  Laterality: Right;  . POLYPECTOMY  2006  . RADIOLOGY WITH ANESTHESIA N/A 01/05/2016   Procedure: MRI BRAIN WITH AND WITHOUT;  Surgeon: Medication Radiologist, MD;  Location: [facility];  Service: Radiology;  Laterality: N/A;    SOCIAL HISTORY:   Social History  Substance Use Topics  . Smoking status: Current Every Day Smoker     Packs/day: 2.00    Years: 60.00  . Smokeless tobacco: Never Used  . Alcohol use Yes    FAMILY HISTORY:   Family History  Problem Relation Age of Onset  . Brain cancer Neg Hx     DRUG ALLERGIES:  No Known Allergies  REVIEW OF SYSTEMS:   ROS  CONSTITUTIONAL: No fever, fatigue or weakness.  EYES: No blurred or double vision.  EARS, NOSE, AND THROAT: No tinnitus or ear pain.  RESPIRATORY: No cough, shortness of breath, wheezing or hemoptysis.  CARDIOVASCULAR: No chest pain, orthopnea, edema.  GASTROINTESTINAL: No nausea, vomiting, diarrhea or abdominal pain.  GENITOURINARY: No dysuria, hematuria.  ENDOCRINE: No polyuria, nocturia,  HEMATOLOGY: No anemia, easy bruising or bleeding SKIN: No rash or lesion. MUSCULOSKELETAL: Right ankle pain NEUROLOGIC: No tingling, numbness, weakness.  PSYCHIATRY: No anxiety or depression.   MEDICATIONS AT HOME:   Prior to Admission medications   Medication Sig Start Date End Date Taking? Authorizing Provider  aspirin EC 81 MG tablet Take 81 mg by mouth daily.   Yes Historical Provider, MD  busPIRone (BUSPAR) 7.5 MG tablet Take 7.5 mg by mouth 2 (two) times daily.   Yes Historical Provider, MD  calcium carbonate (TUMS - DOSED IN MG ELEMENTAL CALCIUM) 500 MG chewable tablet Chew 2 tablets by mouth every 4 (four) hours as needed for indigestion or heartburn.   Yes Historical Provider, MD  carvedilol (COREG) 12.5 MG tablet Take 12.5  mg by mouth 2 (two) times daily.   Yes Historical Provider, MD  citalopram (CELEXA) 40 MG tablet Take 40 mg by mouth daily.   Yes Historical Provider, MD  dexamethasone (DECADRON) 4 MG tablet Take 1 tablet (4 mg total) by mouth every 8 (eight) hours. 01/09/16  Yes Orson Eva, MD  HYDROcodone-acetaminophen (NORCO/VICODIN) 5-325 MG tablet Take 1-2 tablets by mouth every 4 (four) hours as needed for moderate pain. 01/09/16  Yes Orson Eva, MD  Multiple Vitamin (MULTIVITAMIN WITH MINERALS) TABS tablet Take 1 tablet by mouth  daily.   Yes Historical Provider, MD  pantoprazole (PROTONIX) 40 MG tablet '40mg'$  BID for 2 weeks, then '40mg'$  daily 01/31/16  Yes Lytle Butte, MD  QUEtiapine (SEROQUEL) 50 MG tablet Take 1 tablet (50 mg total) by mouth at bedtime. Patient taking differently: Take 50 mg by mouth 2 (two) times daily. 1600 and 2100 01/09/16  Yes Orson Eva, MD  risperiDONE (RISPERDAL) 0.5 MG tablet Take 0.5 mg by mouth at bedtime.   Yes Historical Provider, MD  simvastatin (ZOCOR) 20 MG tablet Take 20 mg by mouth every evening.   Yes Historical Provider, MD      VITAL SIGNS:  Blood pressure 123/79, pulse 99, temperature 97.9 F (36.6 C), temperature source Oral, resp. rate 18, height '5\' 3"'$  (1.6 m), weight 73.5 kg (162 lb), SpO2 96 %.  PHYSICAL EXAMINATION:  GENERAL:  75 y.o.-year-old patient lying in the bed  HEENT: Head atraumatic, normocephalic. Oropharynx and nasopharynx clear.  NECK:  Supple, no jugular venous distention. No thyroid enlargement, no tenderness.  LUNGS: Bilateral basal crackles CARDIOVASCULAR: S1, S2 normal. No murmurs, rubs, or gallops.  ABDOMEN: Soft, nontender, nondistended. Bowel sounds present. No organomegaly or mass.  EXTREMITIES: No pedal edema, cyanosis, or clubbing. Right ankle cast and dressing PSYCHIATRIC: The patient is alert and awake  LABORATORY PANEL:   CBC  Recent Labs Lab 02/02/16 0834  WBC 12.1*  HGB 13.0  HCT 37.9  PLT 192   ------------------------------------------------------------------------------------------------------------------  Chemistries   Recent Labs Lab 01/30/16 0638 02/02/16 0834  NA 138 136  K 3.8 4.0  CL 100* 108  CO2 29 20*  GLUCOSE 164* 141*  BUN 32* 31*  CREATININE 0.94 1.12*  CALCIUM 8.4* 8.1*  AST 26  --   ALT 31  --   ALKPHOS 70  --   BILITOT 0.9  --    ------------------------------------------------------------------------------------------------------------------  Cardiac Enzymes  Recent Labs Lab 01/30/16 2326   TROPONINI 0.05*   ------------------------------------------------------------------------------------------------------------------  RADIOLOGY:  Dg Chest 1 View  Result Date: 02/02/2016 CLINICAL DATA:  Recent fall, un stable right ankle EXAM: CHEST 1 VIEW COMPARISON:  Chest x-ray of 01/30/2016 and CT chest of 01/01/2016 FINDINGS: The lung mass overlying the left aortopulmonary window is not as well seen as on prior chest x-ray and CT of the chest consistent with primary lung carcinoma. Linear atelectasis or scarring remains in the left mid lung with atelectasis at both lung bases. Small pleural effusions cannot be excluded. Calcified right thyroid lesion is unchanged. No pneumothorax is seen. Cardiomegaly is stable. IMPRESSION: 1. The left aortopulmonary window lung mass is not as well seen on the current chest x-ray. 2. Bibasilar linear atelectasis. Linear atelectasis or scarring remains in the left mid lung as well. Electronically Signed   By: Ivar Drape M.D.   On: 02/02/2016 09:16   Dg Ankle 2 Views Right  Result Date: 02/02/2016 CLINICAL DATA:  75 year old female post ankle surgery. Subsequent encounter. EXAM: RIGHT ANKLE -  2 VIEW COMPARISON:  02/02/2016. FINDINGS: Overlying cast obscures fine osseous and soft tissue detail. Post open reduction and internal fixation of right fibula fracture with side plate and screws. Reduction of distal tibia -fibula separation with surgical stay placed between the fibula sideplate and tibia. Better alignment of medial malleolar fracture and ankle mortise. IMPRESSION: Open reduction and internal fixation right ankle fracture as noted above. Electronically Signed   By: Genia Del M.D.   On: 02/02/2016 16:57   Dg Ankle Complete Right  Result Date: 02/02/2016 CLINICAL DATA:  [place] today EXAM: RIGHT ANKLE - COMPLETE 3+ VIEW COMPARISON:  None. FINDINGS: There is a displaced fracture of the medial malleolus with the distal fragment displaced medially. Also there  is tibiotalar subluxation with the talus lateral relative to the distal right tibia. An oblique displaced fracture of the distal fibula also is noted. On the lateral view the posterior malleolus appears intact. A degenerative calcaneal spur is noted. IMPRESSION: 1. Bimalleolar right ankle fractures involving the medial and lateral malleoli. 2. Tibiotalar subluxation. Electronically Signed   By: Ivar Drape M.D.   On: 02/02/2016 09:14   Dg Chest Port 1 View  Result Date: 02/03/2016 CLINICAL DATA:  Hypoxia, history of brain and breast malignancy, hypertension, acute bimalleolar right ankle fracture status post ORIF yesterday. EXAM: PORTABLE CHEST 1 VIEW COMPARISON:  Portable chest x-ray of February 02, 2016 FINDINGS: The lungs are reasonably well inflated. The interstitial markings are slightly less conspicuous. There remain coarse lung markings in the right lower lung and left perihilar region. An AP window mass is again demonstrated. There is calcification in the wall of the aortic arch. There is cardiomegaly. There are bilateral calcified upper paratracheal lymph nodes. IMPRESSION: Cardiomegaly with mild central pulmonary vascular prominence with decreased interstitial edema. Subsegmental atelectasis or scarring in the left perihilar and right infrahilar regions. No pleural effusion or pneumothorax. Large right AP window region mass. Calcified upper paratracheal lymph nodes. Electronically Signed   By: David  Martinique M.D.   On: 02/03/2016 12:53    EKG:   Orders placed or performed during the hospital encounter of 02/02/16  . EKG 12-Lead  . EKG 12-Lead    IMPRESSION AND PLAN:   * Mild pulmonary edema with acute hypoxic respiratory failure Will give 1 dose of IV Lasix. Check daily weight. Patient likely has underlying chronic respiratory failure. ABG showed normal CO2 Nebs when necessary  * Right ankle fracture Status post surgery. Management per orthopedics.  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  All the records are reviewed and case discussed with Consulting provider. Management plans discussed with the patient, family and they are in agreement.  CODE STATUS: DNR  TOTAL CC TIME TAKING CARE OF THIS PATIENT: 40 minutes.   Hillary Bow R M.D on 02/03/2016 at 1:13 PM  Between 7am to 6pm - Pager - [phone]  After 6pm go to [email or web address] - password [email or web address] [facility]  Office  [phone]  CC: Primary care Physician: Lavera Guise, MD  Note: This dictation was prepared with Dragon dictation along with smaller phrase technology. Any transcriptional errors that result from this process are unintentional.

## 2016-02-03 NOTE — Care Management Note (Signed)
Case Management Note  Patient Details  Name: Alexis Buck MRN: [id number] Date of Birth: 21-Aug-1940  Subjective/Objective:   Spoke with patient daughter([name])  over the phone for discharge planning.  She stated that she wants to take her mother home at discharge and does not want her to go to SNF. She requested a wheelchair for home use which I have ordered for the patient from [facility]. Patient is open to  [facility]. Daughter stated that   PT RN Speech and Aid. Resumption  Orders placed. Patient daughter stated that she is able to transfer patient without difficulty. Daughter stated that she would be able to get patient an walker and bedside commode. Contacted Edwina at [facility] to inform of patient discharge plan. Discussed case with Flo Shanks with hospice of [facility] who stated that patient had been referred for palliative consult with [facility] agency at home.Pharmacy is [facility].Patient has 24 hour care at home per daughter.  Action/Plan: Plan is discharge to home with family care.  Resume HH with [facility].    Expected Discharge Date:  02/04/16               Expected Discharge Plan:  [place]  In-House Referral:  Clinical Social Work  Discharge planning Services  CM Consult  Post Acute Care Choice:  Durable Medical Equipment, Home Health Choice offered to:  Adult Children  DME Arranged:  Programmer, multimedia DME Agency:  [facility] Arranged:  PT, RN, Nurse's Aide, Speech Therapy HH Agency:  [facility]  Status of Service:  In process, will continue to follow  If discussed at Long Length of Stay Meetings, dates discussed:    Additional Comments:  Alvie Heidelberg, RN 02/03/2016, 2:37 PM

## 2016-02-03 NOTE — Clinical Social Work Placement (Signed)
   CLINICAL SOCIAL WORK PLACEMENT  NOTE  Date:  02/03/2016  Patient Details  Name: Alexis Buck MRN: [id number] Date of Birth: 1940-12-31  Clinical Social Work is seeking post-discharge placement for this patient at the [facility] level of care (*CSW will initial, date and re-position this form in  chart as items are completed):  Yes   Patient/family provided with [facility] Work Department's list of facilities offering this level of care within the geographic area requested by the patient (or if unable, by the patient's family).  Yes   Patient/family informed of their freedom to choose among providers that offer the needed level of care, that participate in Medicare, Medicaid or managed care program needed by the patient, have an available bed and are willing to accept the patient.  Yes   Patient/family informed of [facility]'s ownership interest in [facility] and [facility], as well as of the fact that they are under no obligation to receive care at these facilities.  PASRR submitted to EDS on       PASRR number received on       Existing PASRR number confirmed on 02/03/16     FL2 transmitted to all facilities in geographic area requested by pt/family on 02/03/16     FL2 transmitted to all facilities within larger geographic area on       Patient informed that his/her managed care company has contracts with or will negotiate with certain facilities, including the following:        Yes   Patient/family informed of bed offers received.  Patient chooses bed at  ([facility] )     Physician recommends and patient chooses bed at      Patient to be transferred to   on  .  Patient to be transferred to facility by       Patient family notified on   of transfer.  Name of family member notified:        PHYSICIAN       Additional Comment:    _______________________________________________ Jaxsen Bernhart, Veronia Beets, LCSW 02/03/2016, 12:17 PM

## 2016-02-04 ENCOUNTER — Inpatient Hospital Stay: Payer: Medicare Other

## 2016-02-04 DIAGNOSIS — L8990 Pressure ulcer of unspecified site, unspecified stage: Secondary | ICD-10-CM | POA: Insufficient documentation

## 2016-02-04 LAB — BASIC METABOLIC PANEL
Anion gap: 11 (ref 5–15)
BUN: 21 mg/dL — AB (ref 6–20)
CHLORIDE: 103 mmol/L (ref 101–111)
CO2: 27 mmol/L (ref 22–32)
Calcium: 7.9 mg/dL — ABNORMAL LOW (ref 8.9–10.3)
Creatinine, Ser: 0.99 mg/dL (ref 0.44–1.00)
GFR calc Af Amer: >60 mL/min (ref >60)
GFR calc non Af Amer: 54 mL/min — ABNORMAL LOW (ref >60)
GLUCOSE: 154 mg/dL — AB (ref 65–99)
POTASSIUM: 2.8 mmol/L — AB (ref 3.5–5.1)
Sodium: 141 mmol/L (ref 135–145)

## 2016-02-04 LAB — POTASSIUM: Potassium: 3.3 mmol/L — ABNORMAL LOW (ref 3.5–5.1)

## 2016-02-04 LAB — PHOSPHORUS: Phosphorus: 2.1 mg/dL — ABNORMAL LOW (ref 2.5–4.6)

## 2016-02-04 LAB — MAGNESIUM: MAGNESIUM: 1.7 mg/dL (ref 1.7–2.4)

## 2016-02-04 MED ORDER — MAGNESIUM SULFATE 2 GM/50ML IV SOLN
2.0000 g | Freq: Once | INTRAVENOUS | Status: AC
  Administered 2016-02-04: 2 g via INTRAVENOUS
  Filled 2016-02-04: qty 50

## 2016-02-04 MED ORDER — POTASSIUM CHLORIDE 20 MEQ PO PACK
40.0000 meq | PACK | Freq: Three times a day (TID) | ORAL | Status: AC
  Administered 2016-02-04 (×3): 40 meq via ORAL
  Filled 2016-02-04 (×3): qty 2

## 2016-02-04 NOTE — Progress Notes (Signed)
Patient Demographics  Alexis Buck, is a 75 y.o. female, DOB - [birth year], GHW:[id number]  Admit date - 02/02/2016   Admitting Physician Hessie Knows, MD  Outpatient Primary MD for the patient is Lavera Guise, MD  LOS - 2  Hypoxia Chief Complaint  Patient presents with  . Fall        Subjective:   Alexis Buck is 75 year old female with right ankle fracture. Medicine consult requested for hypoxia. Patient received Lasix for fluid overload today she feels better but has hypokalemia.     Active Problems:   Displaced bimalleolar fracture of right ankle   Pressure ulcer   Medications  Scheduled Meds: . aspirin EC  81 mg Oral Daily  . busPIRone  7.5 mg Oral BID  . carvedilol  12.5 mg Oral BID  . citalopram  40 mg Oral Daily  . dexamethasone  4 mg Oral Q8H  . docusate sodium  100 mg Oral BID  . furosemide  40 mg Oral BID  . multivitamin with minerals  1 tablet Oral Daily  . pantoprazole  40 mg Oral Daily  . potassium chloride  40 mEq Oral TID  . QUEtiapine  50 mg Oral BID  . risperiDONE  0.5 mg Oral QHS  . simvastatin  20 mg Oral QPM   Continuous Infusions:  Lab Results  Component Value Date   PLT 192 02/02/2016    Antibiotics    Anti-infectives    Start     Dose/Rate Route Frequency Ordered Stop   02/02/16 2000  ceFAZolin (ANCEF) IVPB 2g/100 mL premix     2 g 200 mL/hr over 30 Minutes Intravenous Every 6 hours 02/02/16 1656 02/03/16 1344   02/02/16 1145  ceFAZolin (ANCEF) IVPB 2g/100 mL premix     2 g 200 mL/hr over 30 Minutes Intravenous On call to O.R. 02/02/16 1139 02/02/16 1433          Objective:   Vitals:   02/03/16 1940 02/04/16 0650 02/04/16 0834 02/04/16 1117  BP: 102/68 119/90 125/84   Pulse: 83 80 83 78  Resp: '18 19 15   '$ Temp: 98.1 F (36.7 C) 98.5 F (36.9 C) 98.1 F (36.7 C)   TempSrc: Oral     SpO2: 90% 92% 96% 92%  Weight:      Height:         Wt Readings from Last 3 Encounters:  02/02/16 73.5 kg (162 lb)  01/30/16 67.1 kg (147 lb 14.4 oz)  01/27/16 72.7 kg (160 lb 6.2 oz)     Intake/Output Summary (Last 24 hours) at 02/04/16 1321 Last data filed at 02/04/16 0900  Gross per 24 hour  Intake              120 ml  Output             2000 ml  Net            -1880 ml   review Of systems: Patient alert, awake, oriented. Cardiovascular ;denies chest pain.  Pulmonary no shortness of breath, no wheezing. GI no nausea or vomiting.  Neurological he is alert, awake, oriented.   Physical Exam; patient is on 3 L of oxygen saturation 92%.  Awake Alert, Oriented X 3, No  new F.N deficits, Normal affect [place].AT,PERRAL Supple Neck,No JVD, No cervical lymphadenopathy appriciated.  Symmetrical Chest wall movement, Good air movement bilaterally, CTAB RRR,No Gallops,Rubs or new Murmurs, No Parasternal Heave +ve B.Sounds, Abd Soft, No tenderness, No organomegaly appriciated, No rebound - guarding or rigidity. Bilateral pitting edema  Data Review   Micro Results Recent Results (from the past 240 hour(s))  Surgical PCR screen     Status: None   Collection Time: 02/02/16 12:20 PM  Result Value Ref Range Status   MRSA, PCR NEGATIVE NEGATIVE Final   Staphylococcus aureus NEGATIVE NEGATIVE Final    Comment:        The Xpert SA Assay (FDA approved for NASAL specimens in patients over 32 years of age), is one component of a comprehensive surveillance program.  Test performance has been validated by [facility] for patients greater than or equal to 76 year old. It is not intended to diagnose infection nor to guide or monitor treatment.     Radiology Reports Dg Chest 1 View  Result Date: 02/02/2016 CLINICAL DATA:  Recent fall, un stable right ankle EXAM: CHEST 1 VIEW COMPARISON:  Chest x-ray of 01/30/2016 and CT chest of 01/01/2016 FINDINGS: The lung mass overlying the left aortopulmonary window is not as well seen as on prior  chest x-ray and CT of the chest consistent with primary lung carcinoma. Linear atelectasis or scarring remains in the left mid lung with atelectasis at both lung bases. Small pleural effusions cannot be excluded. Calcified right thyroid lesion is unchanged. No pneumothorax is seen. Cardiomegaly is stable. IMPRESSION: 1. The left aortopulmonary window lung mass is not as well seen on the current chest x-ray. 2. Bibasilar linear atelectasis. Linear atelectasis or scarring remains in the left mid lung as well. Electronically Signed   By: Ivar Drape M.D.   On: 02/02/2016 09:16   Dg Ankle 2 Views Right  Result Date: 02/02/2016 CLINICAL DATA:  75 year old female post ankle surgery. Subsequent encounter. EXAM: RIGHT ANKLE - 2 VIEW COMPARISON:  02/02/2016. FINDINGS: Overlying cast obscures fine osseous and soft tissue detail. Post open reduction and internal fixation of right fibula fracture with side plate and screws. Reduction of distal tibia -fibula separation with surgical stay placed between the fibula sideplate and tibia. Better alignment of medial malleolar fracture and ankle mortise. IMPRESSION: Open reduction and internal fixation right ankle fracture as noted above. Electronically Signed   By: Genia Del M.D.   On: 02/02/2016 16:57   Dg Ankle Complete Right  Result Date: 02/02/2016 CLINICAL DATA:  [place] today EXAM: RIGHT ANKLE - COMPLETE 3+ VIEW COMPARISON:  None. FINDINGS: There is a displaced fracture of the medial malleolus with the distal fragment displaced medially. Also there is tibiotalar subluxation with the talus lateral relative to the distal right tibia. An oblique displaced fracture of the distal fibula also is noted. On the lateral view the posterior malleolus appears intact. A degenerative calcaneal spur is noted. IMPRESSION: 1. Bimalleolar right ankle fractures involving the medial and lateral malleoli. 2. Tibiotalar subluxation. Electronically Signed   By: Ivar Drape M.D.   On:  02/02/2016 09:14   Dg Chest Port 1 View  Result Date: 02/03/2016 CLINICAL DATA:  Hypoxia, history of brain and breast malignancy, hypertension, acute bimalleolar right ankle fracture status post ORIF yesterday. EXAM: PORTABLE CHEST 1 VIEW COMPARISON:  Portable chest x-ray of February 02, 2016 FINDINGS: The lungs are reasonably well inflated. The interstitial markings are slightly less conspicuous. There remain coarse lung markings  in the right lower lung and left perihilar region. An AP window mass is again demonstrated. There is calcification in the wall of the aortic arch. There is cardiomegaly. There are bilateral calcified upper paratracheal lymph nodes. IMPRESSION: Cardiomegaly with mild central pulmonary vascular prominence with decreased interstitial edema. Subsegmental atelectasis or scarring in the left perihilar and right infrahilar regions. No pleural effusion or pneumothorax. Large right AP window region mass. Calcified upper paratracheal lymph nodes. Electronically Signed   By: David  Martinique M.D.   On: 02/03/2016 12:53   Dg Abdomen Acute W/chest  Result Date: 01/30/2016 CLINICAL DATA:  75 year old female with a history of intermittent abdominal pain EXAM: DG ABDOMEN ACUTE W/ 1V CHEST COMPARISON:  CT chest 01/01/2016, plain film 01/01/2016 FINDINGS: Chest: Cardiomediastinal silhouette unchanged. Masslike opacity in the left upper lobe, compatible with known tumor identified on prior plain film and CT. Linear opacities of the left lung, new from the plain film comparison. Fullness in the right hilar region. No pneumothorax.  No large pleural effusion. Calcified thyroid nodules again noted. Abdomen: Gas within stomach, small bowel, colon, without abnormal distention. Rounded calcifications in the right upper abdomen at the inferior liver margin may be within kidney or gallbladder. Fibroid changes of the uterus. No radiopaque foreign body. No displaced fracture. Degenerative changes of the spine.  IMPRESSION: Chest: Low lung volumes with bilateral atelectasis. Re- demonstration of known left lung mass, better characterized on recent CT. Abdomen: Nonobstructive bowel gas pattern. Rounded calcifications at the inferior liver margin, potentially within gallbladder or the right kidney collecting system. Fibroid changes of the uterus. Signed, Dulcy Fanny. Earleen Newport, DO [facility] Radiology Electronically Signed   By: Corrie Mckusick D.O.   On: 01/30/2016 08:49     CBC  Recent Labs Lab 01/30/16 1056 01/30/16 1725 01/30/16 2326 01/31/16 0605 02/02/16 0834  WBC 10.1 9.5 8.5 9.1 12.1*  HGB 14.0 13.5 12.4 13.6 13.0  HCT 40.4 39.1 35.2 38.9 37.9  PLT 167 156 135* 142* 192  MCV 90.4 91.2 89.8 92.1 91.7  MCH 31.4 31.4 31.6 32.2 31.4  MCHC 34.7 34.5 35.3 35.0 34.3  RDW 16.4* 16.7* 16.8* 16.6* 17.0*    Chemistries   Recent Labs Lab 01/30/16 0638 02/02/16 0834 02/04/16 0429  NA 138 136 141  K 3.8 4.0 2.8*  CL 100* 108 103  CO2 29 20* 27  GLUCOSE 164* 141* 154*  BUN 32* 31* 21*  CREATININE 0.94 1.12* 0.99  CALCIUM 8.4* 8.1* 7.9*  AST 26  --   --   ALT 31  --   --   ALKPHOS 70  --   --   BILITOT 0.9  --   --    ------------------------------------------------------------------------------------------------------------------ estimated creatinine clearance is 46.4 mL/min (by C-G formula based on SCr of 0.99 mg/dL). ------------------------------------------------------------------------------------------------------------------ No results for input(s): HGBA1C in the last 72 hours. ------------------------------------------------------------------------------------------------------------------ No results for input(s): CHOL, HDL, LDLCALC, TRIG, CHOLHDL, LDLDIRECT in the last 72 hours. ------------------------------------------------------------------------------------------------------------------ No results for input(s): TSH, T4TOTAL,  T3FREE, THYROIDAB in the last 72 hours.  Invalid input(s): FREET3 ------------------------------------------------------------------------------------------------------------------ No results for input(s): VITAMINB12, FOLATE, FERRITIN, TIBC, IRON, RETICCTPCT in the last 72 hours.  Coagulation profile  Recent Labs Lab 01/30/16 0638  INR 0.98    No results for input(s): DDIMER in the last 72 hours.  Cardiac Enzymes  Recent Labs Lab 01/30/16 1056 01/30/16 1725 01/30/16 2326  TROPONINI 0.07* 0.06* 0.05*   ------------------------------------------------------------------------------------------------------------------ Invalid input(s): POCBNP #1.right ankle fracture , bimalleolar fracture  status post repair. Seen by  Ortho,, continue physical therapy, pain medicine. #2.acute respiratory failure with hypoxia secondary to CHF exacerbation: Continue Lasix, oxygen, clinically improving. Repeat chest x-ray, check echocardiogram. Likely secondary to fluid overload, patient BNP is elevated to 2000s.; And Lasix 40 mg by mouth twice a day. #3 hypokalemia due to diuretics: He has the potassium. #4 .facial hypertension: Controlled continue Correctol 0.5 mg by mouth twice a day. 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 She is on Decadron.      Time Spent in minutes   53mn   Tyquisha Sharps M.D on 02/04/2016 at 1:21 PM

## 2016-02-04 NOTE — Progress Notes (Addendum)
   Subjective: 2 Days Post-Op Procedure(s) (LRB): OPEN REDUCTION INTERNAL FIXATION (ORIF) DISTAL ANKLE FRACTURE WITH SENODESMOSIS REPAIR (Right) Patient reports pain as mild.   Patient is well, and has had no acute complaints or problems. Yesterday hypoxic. Medicine consulted. Work up complete. Doing well this am Denies any CP, SOB, ABD pain. We will continue therapy today.  Plan is to go Skilled nursing facility after hospital stay.  Objective: Vital signs in last 24 hours: Temp:  [97.6 F (36.4 C)-98.5 F (36.9 C)] 98.5 F (36.9 C) (09/14 0650) Pulse Rate:  [80-99] 80 (09/14 0650) Resp:  [18-21] 19 (09/14 0650) BP: (102-123)/(68-90) 119/90 (09/14 0650) SpO2:  [90 %-96 %] 92 % (09/14 0650)  Intake/Output from previous day: 09/13 0701 - 09/14 0700 In: -  Out: 1350 [Urine:1350] Intake/Output this shift: Total I/O In: -  Out: 650 [Urine:650]   Recent Labs  02/02/16 0834  HGB 13.0    Recent Labs  02/02/16 0834  WBC 12.1*  RBC 4.14  HCT 37.9  PLT 192    Recent Labs  02/02/16 0834 02/04/16 0429  NA 136 141  K 4.0 2.8*  CL 108 103  CO2 20* 27  BUN 31* 21*  CREATININE 1.12* 0.99  GLUCOSE 141* 154*  CALCIUM 8.1* 7.9*   No results for input(s): LABPT, INR in the last 72 hours.  EXAM General - Patient is Alert, Appropriate and Oriented Extremity - Neurovascular intact Sensation intact distally Intact pulses distally Compartment soft short leg cast intact Dressing - dressing C/D/I and no drainage Motor Function - intact, moving toes well on exam.   Past Medical History:  Diagnosis Date  . Benign neoplasm of breast 2009  . Brain tumor ([place])   . Cancer ([place])   . Hypertension     Assessment/Plan:   2 Days Post-Op Procedure(s) (LRB): OPEN REDUCTION INTERNAL FIXATION (ORIF) DISTAL ANKLE FRACTURE WITH SENODESMOSIS REPAIR (Right) Active Problems:   Displaced bimalleolar fracture of right ankle   Hypokalemia   Estimated body mass index is 28.7 kg/m  as calculated from the following:   Height as of this encounter: '5\' 3"'$  (1.6 m).   Weight as of this encounter: 73.5 kg (162 lb). Advance diet Up with therapy, toe touch weight bearing right lower extremity Hypokalemia- will start oral K+ and recheck K this pm CM to assist with discharge Follow up with [place] [facility] in 2 weeks for staple removal   DVT Prophylaxis - Lovenox, Foot Pumps and TED hose    T. Rachelle Hora, PA-C [place] 02/04/2016, 7:44 AM

## 2016-02-04 NOTE — Progress Notes (Signed)
Physical Therapy Treatment Patient Details Name: Alexis Buck MRN: [id number] DOB: July 08, 1940 Today's Date: 02/04/2016    History of Present Illness Pt is a 75 y.o. female presents with Right ankle pain and deformity. Onset of symptoms was abrupt starting 5 hours ago with unchanged course since that time. The pain is located all around the right ankle. Patient describes the pain as dull continuous and rated as moderate. Pain has been associated with a fall she thinks her ankle rolled under. Patient denies loss of consciousness. Symptoms are aggravated by weightbearing. Symptoms improve with reduction in ER. Past history includes recent diagnosis of tumor with brain metastasis and was to start radiation therapy today.  Previous studies include x-rays in ER showing a displaced bimalleolar ankle fracture. Pt is s/p R ankle ORIF.     PT Comments    Pt agreeable to PT; denies pain. Pt very lethargic this afternoon, but denies fatigue. Pt participates in supine bed exercises requiring increased assist, verbal instruction and tactile cues to perform. Pt cognition slower and begins conversation that make little sense. Pt states, "I'm stupid, I'm on a funny pill" Due to lethargy and decreased cognition suspect to medication, no further attempt for mobilization out of bed this session. Continue PT to progress strength, endurance, and safety to allow for improved functional mobility.   Follow Up Recommendations  SNF     Equipment Recommendations  Rolling walker with 5" wheels    Recommendations for Other Services       Precautions / Restrictions Precautions Precautions: Fall Restrictions Weight Bearing Restrictions: Yes RLE Weight Bearing: Touchdown weight bearing    Mobility  Bed Mobility               General bed mobility comments: Not tested due to level of lethargy and difficulty folllowing instruction this afternoon  Transfers                    Ambulation/Gait                  Stairs            Wheelchair Mobility    Modified Rankin (Stroke Patients Only)       Balance                                    Cognition Arousal/Alertness: Lethargic Behavior During Therapy: WFL for tasks assessed/performed (falling asleep) Overall Cognitive Status: Impaired/Different from baseline Area of Impairment: Attention;Following commands;Problem solving   Current Attention Level: Alternating Memory: Decreased short-term memory Following Commands: Follows one step commands inconsistently     Problem Solving: Slow processing General Comments: Pt with difficulty following/understanding repeated instructions this afternoon. Pt begins talking several times in attempt to ask a question, but makes little sense. Pt states "I'm stupid, I'm on a funny pill"    Exercises Total Joint Exercises Ankle Circles/Pumps: AROM;Left;20 reps;Supine Quad Sets: Strengthening;Both;20 reps;Supine Gluteal Sets: Strengthening;Both;20 reps;Supine Short Arc Quad: Both;20 reps;Supine;AAROM Heel Slides: AAROM;Right;20 reps;Supine (RROM L 20x) Hip ABduction/ADduction: AAROM;Right;20 reps;Supine (AROM 20x L) Straight Leg Raises: AAROM;10 reps;Supine;Both (Strengthening L 10x ) Other Exercises Other Exercises: Pt requires repeated instruction and tactile cues for proper exercise technique and re awakening several times; however, pt denies fatigue    General Comments        Pertinent Vitals/Pain Pain Assessment: No/denies pain    Home Living  Prior Function            PT Goals (current goals can now be found in the care plan section) Progress towards PT goals: Progressing toward goals    Frequency  BID    PT Plan Current plan remains appropriate    Co-evaluation             End of Session Equipment Utilized During Treatment: Oxygen Activity Tolerance: Patient limited by fatigue;Patient limited by  lethargy Patient left: in bed;with call bell/phone within reach;with bed alarm set;with family/visitor present     Time: 1453-1516 PT Time Calculation (min) (ACUTE ONLY): 23 min  Charges:  $Therapeutic Exercise: 23-37 mins                    G CodesLarae Grooms, PTA 02/04/2016, 3:57 PM

## 2016-02-04 NOTE — Progress Notes (Signed)
Clinical Education officer, museum (CSW) contacted patient's daughter Nelida Gores and made her aware that [facility] has also made a bed offer. Daughter declined SNF and reported that she still wants to take patient home with home health and a wheel chair. RN Case Manager aware of above. CSW will continue to follow and assist as needed.   [employer], LCSW [phone]

## 2016-02-04 NOTE — Progress Notes (Signed)
Physical Therapy Treatment Patient Details Name: JAELINE WHOBREY MRN: [id number] DOB: August 15, 1940 Today's Date: 02/04/2016    History of Present Illness  Pt is a 75 y.o. female presents with Right ankle pain and deformity. Onset of symptoms was abrupt starting 5 hours ago with unchanged course since that time. The pain is located all around the right ankle. Patient describes the pain as dull continuous and rated as moderate. Pain has been associated with a fall she thinks her ankle rolled under. Patient denies loss of consciousness. Symptoms are aggravated by weightbearing. Symptoms improve with reduction in ER. Past history includes recent diagnosis of tumor with brain metastasis and was to start radiation therapy today. Previous studies include x-rays in ER showing a displaced bimalleolar ankle fracture. Pt is s/p R ankle ORIF.     PT Comments    Pt agreeable to PT; reports pain in Right lower extremity, but does not qualify or quantify. Visitor present who states she is a "close family friend". Pt participates well in supine bed exercises without increase pain. Pt agreeable to up in chair. Pt demonstrates good effort in mobilizing to edge of bed with decreasing assist required. Pt does take increased time. Transfer to stand with Mod A; requires assist to maintain touch down weight bear on the right. Bed to chair with assist also to maintain correct weight bear status. Pt requires increased instruction and time to step bed to chair with long pauses between steps processing instruction and mild difficulty following at times. Pt received up in chair comfortably. It is noted that visitor gave therapist agitated/argumentative behavior/feedback throughout session regarding room temperature, pt being mobilized out of bed and protocol of setting pt up with phone/belongings. Visitor overtly stood in therapist way several times without effort to move despite common courtesy etiquette provided toward visitor.  Nursing notified. Plan to see pt this afternoon for continued work on strength, safety and functional mobility.   Follow Up Recommendations  SNF     Equipment Recommendations  Rolling walker with 5" wheels    Recommendations for Other Services       Precautions / Restrictions Precautions Precautions: Fall Restrictions Weight Bearing Restrictions: Yes RLE Weight Bearing: Touchdown weight bearing    Mobility  Bed Mobility Overal bed mobility: Needs Assistance Bed Mobility: Supine to Sit     Supine to sit: Min assist     General bed mobility comments: Assist for final part of trunk elevation. Pt demonstrates good effort. Denies dizziness  Transfers Overall transfer level: Needs assistance Equipment used: Rolling walker (2 wheeled) Transfers: Sit to/from Stand Sit to Stand: Mod assist         General transfer comment: Slow to rise and attain full upright position, but does so ultimately. Assist to maintain TDW on R.   Ambulation/Gait Ambulation/Gait assistance: Mod assist Ambulation Distance (Feet): 3 Feet Assistive device: Rolling walker (2 wheeled) Gait Pattern/deviations: Step-to pattern Gait velocity: slow Gait velocity interpretation: <1.8 ft/sec, indicative of risk for recurrent falls General Gait Details: Difficulty maintaining TDW on R; assist and feedback given when too much weight applied. Pt denies pain in RLE with stand/ambulation. Increased time to process steps and requires heavy instruction on each step and rw placement. Pt would benefit from youth size rw for improved weightbearing through UEs    Stairs            Wheelchair Mobility    Modified Rankin (Stroke Patients Only)       Balance Overall balance assessment: Needs  assistance Sitting-balance support: Feet supported;Bilateral upper extremity supported Sitting balance-Leahy Scale: Fair     Standing balance support: Bilateral upper extremity supported Standing balance-Leahy Scale:  Poor Standing balance comment: Requires assist for stand with appropriate wb'g status on R                    Cognition Arousal/Alertness: Awake/alert Behavior During Therapy: WFL for tasks assessed/performed Overall Cognitive Status: Within Functional Limits for tasks assessed                      Exercises Total Joint Exercises Ankle Circles/Pumps: AROM;Left;20 reps;Supine Quad Sets: Strengthening;Both;20 reps;Supine Gluteal Sets: Strengthening;Both;20 reps;Supine Short Arc Quad: AROM;Both;20 reps;Supine Heel Slides: AAROM;Right;20 reps;Supine (RROM L 20x) Hip ABduction/ADduction: AAROM;Right;20 reps;Supine (RROM on L 20x) Straight Leg Raises: AAROM;10 reps;Supine;Right (Strengthening L 10x )    General Comments        Pertinent Vitals/Pain Pain Assessment: 0-10 Pain Location: R ankle Pain Descriptors / Indicators:  (does not quantify or qualify) Pain Intervention(s): Monitored during session;Limited activity within patient's tolerance;Premedicated before session    Home Living                      Prior Function            PT Goals (current goals can now be found in the care plan section) Progress towards PT goals: Progressing toward goals    Frequency  BID    PT Plan Current plan remains appropriate    Co-evaluation             End of Session Equipment Utilized During Treatment: Gait belt;Oxygen Activity Tolerance: Patient tolerated treatment well Patient left: in chair;with call bell/phone within reach;with chair alarm set;with family/visitor present     Time: 1572-6203 PT Time Calculation (min) (ACUTE ONLY): 35 min  Charges:  $Gait Training: 8-22 mins $Therapeutic Exercise: 8-22 mins                    G Codes:      Larae Grooms, PTA 02/04/2016, 1:24 PM

## 2016-02-05 DIAGNOSIS — J9621 Acute and chronic respiratory failure with hypoxia: Secondary | ICD-10-CM

## 2016-02-05 DIAGNOSIS — I5031 Acute diastolic (congestive) heart failure: Secondary | ICD-10-CM

## 2016-02-05 DIAGNOSIS — R531 Weakness: Secondary | ICD-10-CM

## 2016-02-05 DIAGNOSIS — C3490 Malignant neoplasm of unspecified part of unspecified bronchus or lung: Secondary | ICD-10-CM

## 2016-02-05 DIAGNOSIS — Z5181 Encounter for therapeutic drug level monitoring: Secondary | ICD-10-CM | POA: Diagnosis not present

## 2016-02-05 DIAGNOSIS — R32 Unspecified urinary incontinence: Secondary | ICD-10-CM

## 2016-02-05 DIAGNOSIS — R159 Full incontinence of feces: Secondary | ICD-10-CM

## 2016-02-05 LAB — BASIC METABOLIC PANEL
ANION GAP: 7 (ref 5–15)
BUN: 17 mg/dL (ref 6–20)
CALCIUM: 8 mg/dL — AB (ref 8.9–10.3)
CO2: 38 mmol/L — ABNORMAL HIGH (ref 22–32)
CREATININE: 0.8 mg/dL (ref 0.44–1.00)
Chloride: 96 mmol/L — ABNORMAL LOW (ref 101–111)
GFR calc Af Amer: >60 mL/min (ref >60)
GLUCOSE: 122 mg/dL — AB (ref 65–99)
Potassium: 3.3 mmol/L — ABNORMAL LOW (ref 3.5–5.1)
Sodium: 141 mmol/L (ref 135–145)

## 2016-02-05 LAB — MAGNESIUM: Magnesium: 2.2 mg/dL (ref 1.7–2.4)

## 2016-02-05 LAB — PHOSPHORUS: Phosphorus: 2.1 mg/dL — ABNORMAL LOW (ref 2.5–4.6)

## 2016-02-05 MED ORDER — POTASSIUM & SODIUM PHOSPHATES 280-160-250 MG PO PACK
1.0000 | PACK | Freq: Three times a day (TID) | ORAL | Status: DC
  Administered 2016-02-05: 1 via ORAL
  Filled 2016-02-05 (×2): qty 1

## 2016-02-05 MED ORDER — POTASSIUM & SODIUM PHOSPHATES 280-160-250 MG PO PACK: 2.0000 | PACK | Freq: Three times a day (TID) | ORAL | Status: DC

## 2016-02-05 MED ORDER — POTASSIUM & SODIUM PHOSPHATES 280-160-250 MG PO PACK: 1.0000 | PACK | Freq: Three times a day (TID) | ORAL | 0 refills | Status: AC

## 2016-02-05 MED ORDER — POTASSIUM CHLORIDE CRYS ER 20 MEQ PO TBCR
40.0000 meq | EXTENDED_RELEASE_TABLET | ORAL | Status: DC
  Administered 2016-02-05: 40 meq via ORAL
  Filled 2016-02-05: qty 2

## 2016-02-05 MED ORDER — OXYCODONE HCL 5 MG PO TABS: 5.0000 mg | ORAL_TABLET | ORAL | 0 refills | Status: AC | PRN

## 2016-02-05 NOTE — Care Management Important Message (Signed)
Important Message  Patient Details  Name: Alexis Buck MRN: [id number] Date of Birth: [birth year]   Medicare Important Message Given:  Yes    Alvie Heidelberg, RN 02/05/2016, 9:49 AM

## 2016-02-05 NOTE — Progress Notes (Addendum)
   Subjective: 3 Days Post-Op Procedure(s) (LRB): OPEN REDUCTION INTERNAL FIXATION (ORIF) DISTAL ANKLE FRACTURE WITH SENODESMOSIS REPAIR (Right) Patient reports pain as mild.   Patient is well, and has had no acute complaints or problems. No SOB.  Denies any CP, SOB, ABD pain. We will continue therapy today.  Plan is to go Skilled nursing facility after hospital stay.  Objective: Vital signs in last 24 hours: Temp:  [97.7 F (36.5 C)-98.2 F (36.8 C)] 98.2 F (36.8 C) (09/15 0502) Pulse Rate:  [71-83] 79 (09/15 0502) Resp:  [15-20] 20 (09/15 0502) BP: (119-157)/(67-89) 157/89 (09/15 0502) SpO2:  [92 %-96 %] 95 % (09/15 0502)  Intake/Output from previous day: 09/14 0701 - 09/15 0700 In: 240 [P.O.:240] Out: 1100 [Urine:1100] Intake/Output this shift: No intake/output data recorded.   Recent Labs  02/02/16 0834  HGB 13.0    Recent Labs  02/02/16 0834  WBC 12.1*  RBC 4.14  HCT 37.9  PLT 192    Recent Labs  02/04/16 0429 02/04/16 1547 02/05/16 0444  NA 141  --  141  K 2.8* 3.3* 3.3*  CL 103  --  96*  CO2 27  --  38*  BUN 21*  --  17  CREATININE 0.99  --  0.80  GLUCOSE 154*  --  122*  CALCIUM 7.9*  --  8.0*   No results for input(s): LABPT, INR in the last 72 hours.  EXAM General - Patient is Alert, Appropriate and Oriented Extremity - Neurovascular intact Sensation intact distally Intact pulses distally Compartment soft short leg cast intact Dressing - dressing C/D/I and no drainage Motor Function - intact, moving toes well on exam.   Past Medical History:  Diagnosis Date  . Benign neoplasm of breast 2009  . Brain tumor ([place])   . Cancer ([place])   . Hypertension     Assessment/Plan:   3 Days Post-Op Procedure(s) (LRB): OPEN REDUCTION INTERNAL FIXATION (ORIF) DISTAL ANKLE FRACTURE WITH SENODESMOSIS REPAIR (Right) Active Problems:   Displaced bimalleolar fracture of right ankle   Pressure ulcer   Hypokalemia   Estimated body mass index is  28.7 kg/m as calculated from the following:   Height as of this encounter: '5\' 3"'$  (1.6 m).   Weight as of this encounter: 73.5 kg (162 lb). Advance diet Up with therapy, toe touch weight bearing right lower extremity Hypokalemia- resolved Discharge home today pending medical clearance Follow up with [facility] in 11 days for staple removal   DVT Prophylaxis - Lovenox, Foot Pumps and TED hose    T. Rachelle Hora, PA-C [place] 02/05/2016, 7:53 AM

## 2016-02-05 NOTE — Progress Notes (Signed)
Patient discharged home. DC instructions provided and explained. Medications reviewed. Rx given. All questions answered. No complaints of pain or shortness of breath. Oxygen at 1.5 liter sat 96%. Family instructed on use of oxygen tanks. Pt stable at discharge.

## 2016-02-05 NOTE — Discharge Instructions (Signed)
Diet: As you were doing prior to hospitalization   Shower:  Keep cast clean and dry at all times  Dressing:  You may change your dressing as needed. Change the dressing with sterile gauze dressing.    Activity:  Increase activity slowly as tolerated, but follow the weight bearing instructions below.  No lifting or driving for 6 weeks.  Weight Bearing:  Toe touch weight bearing as tolerated to right lower extremity  To prevent constipation: you may use a stool softener such as -  Colace (over the counter) 100 mg by mouth twice a day  Drink plenty of fluids (prune juice may be helpful) and high fiber foods Miralax (over the counter) for constipation as needed.    Itching:  If you experience itching with your medications, try taking only a single pain pill, or even half a pain pill at a time.  You may take up to 10 pain pills per day, and you can also use benadryl over the counter for itching or also to help with sleep.   Precautions:  If you experience chest pain or shortness of breath - call 911 immediately for transfer to the hospital emergency department!!  If you develop a fever greater that 101 F, purulent drainage from wound, increased redness or drainage from wound, or calf pain-Call [place] [facility] [place]                                              Follow- Up Appointment:  Please call for an appointment to be seen in 2 weeks at [facility]

## 2016-02-05 NOTE — Progress Notes (Signed)
SATURATION QUALIFICATIONS: (This note is used to comply with regulatory documentation for home oxygen)  Patient Saturations on Room Air at Rest = 87%  Patient Saturations on Room Air while Ambulating   Patient Saturations on  Liters of oxygen while Ambulating Please briefly explain why patient needs home oxygen:

## 2016-02-05 NOTE — Progress Notes (Signed)
Physical Therapy Treatment Patient Details Name: Alexis Buck MRN: [id number] DOB: 12-Sep-1940 Today's Date: 02/05/2016    History of Present Illness Pt is a 75 y.o. female presents with Right ankle pain and deformity. Onset of symptoms was abrupt starting 5 hours ago with unchanged course since that time. The pain is located all around the right ankle. Patient describes the pain as dull continuous and rated as moderate. Pain has been associated with a fall she thinks her ankle rolled under. Patient denies loss of consciousness. Symptoms are aggravated by weightbearing. Symptoms improve with reduction in ER. Past history includes recent diagnosis of tumor with brain metastasis and was to start radiation therapy today.  Previous studies include x-rays in ER showing a displaced bimalleolar ankle fracture. Pt is s/p R ankle ORIF.     PT Comments    Pt is initially confused and not interested in working with PT, but does finally agree to work with PT with some encouragement.  She shows ability to participate with exercises but continues to struggle to truly maintain minimal WBing on R (through cast) during transfers/standing.  Pt limited but able to participate and follow some cues.  Follow Up Recommendations  SNF     Equipment Recommendations       Recommendations for Other Services       Precautions / Restrictions Precautions Precautions: Fall Restrictions RLE Weight Bearing: Touchdown weight bearing    Mobility  Bed Mobility Overal bed mobility: Needs Assistance Bed Mobility: Supine to Sit     Supine to sit: Min assist     General bed mobility comments: Pt able to get to sitting but does need assist and a lot of cuing  Transfers Overall transfer level: Needs assistance Equipment used: Rolling walker (2 wheeled) Transfers: Sit to/from Stand Sit to Stand: Mod assist;Max assist         General transfer comment: Pt struggles to maintain TDWBing only on the R, she does not  appear to put all her weight down but is not responsive to cues.  Ambulation/Gait Ambulation/Gait assistance: Max assist;Mod assist Ambulation Distance (Feet): 3 Feet Assistive device: Rolling walker (2 wheeled)       General Gait Details: Difficulty maintaining TDW on R; assist and feedback given when too much weight applied. Pt denies pain in RLE with stand/ambulation. Increased time to process steps and requires heavy instruction on each step and rw placement.    Stairs            Wheelchair Mobility    Modified Rankin (Stroke Patients Only)       Balance     Sitting balance-Leahy Scale: Fair       Standing balance-Leahy Scale: Fair                      Cognition Arousal/Alertness: Lethargic Behavior During Therapy: Restless Overall Cognitive Status: Difficult to assess Area of Impairment: Attention;Following commands;Problem solving                    Exercises Total Joint Exercises Ankle Circles/Pumps: AROM;20 reps;Left Quad Sets: 15 reps;Both;Strengthening Gluteal Sets: Strengthening;15 reps;Both Short Arc Quad: 10 reps;Both;AROM Hip ABduction/ADduction: 15 reps;AROM;Both Straight Leg Raises: AROM;10 reps    General Comments        Pertinent Vitals/Pain Pain Assessment: No/denies pain    Home Living                      Prior Function  PT Goals (current goals can now be found in the care plan section) Progress towards PT goals: Progressing toward goals    Frequency   PT Diagnosis  BID   Bimalleolar fracture of right ankle, closed, initial encounter  Fall, initial encounter  Subluxation of ankle joint, right, initial encounter  Renal insufficiency  Status post open reduction with internal fixation (ORIF) of fracture of ankle - Plan: CANCELED: DG Ankle 2 Views Right, CANCELED: DG Ankle 2 Views Right  Status post surgery - Plan: DG Ankle 2 Views Right, DG Ankle 2 Views Right  Hypoxia - Plan:  DG Chest Port 1 View, DG Chest Port 1 View  SOB (shortness of breath) - Plan: DG Chest 1 View, DG Chest 1 View  Muscle weakness (generalized)     PT Plan Current plan remains appropriate    Co-evaluation             End of Session Equipment Utilized During Treatment: Oxygen Activity Tolerance: Treatment limited secondary to agitation;Patient limited by fatigue Patient left: with chair alarm set;with call bell/phone within reach     Time: 0831-0855 PT Time Calculation (min) (ACUTE ONLY): 24 min  Charges:  $Therapeutic Exercise: 8-22 mins $Therapeutic Activity: 8-22 mins                    G Codes:      Kreg Shropshire, DPT 02/05/2016, 10:39 AM

## 2016-02-05 NOTE — Care Management (Addendum)
O2 will be setup with [place] and wheelchair will be delivered to room prior to discharge. Contacted [place] at [facility] to inform of discharge today. Spoke with daughter Alexis Buck via telephone to inform of pending discharge today.

## 2016-02-06 DIAGNOSIS — Z5181 Encounter for therapeutic drug level monitoring: Secondary | ICD-10-CM | POA: Diagnosis not present

## 2016-02-06 LAB — BLOOD GAS, ARTERIAL
Acid-Base Excess: 0 mmol/L (ref 0.0–2.0)
Bicarbonate: 23.6 mmol/L (ref 20.0–28.0)
FIO2: 0.32
O2 SAT: 86.2 %
PCO2 ART: 34 mmHg (ref 32.0–48.0)
PH ART: 7.45 (ref 7.350–7.450)
PO2 ART: 49 mmHg — AB (ref 83.0–108.0)
Patient temperature: 37

## 2016-02-09 ENCOUNTER — Ambulatory Visit: Payer: Medicare Other

## 2016-02-09 DIAGNOSIS — Z5181 Encounter for therapeutic drug level monitoring: Secondary | ICD-10-CM | POA: Diagnosis not present

## 2016-02-09 DIAGNOSIS — R918 Other nonspecific abnormal finding of lung field: Secondary | ICD-10-CM | POA: Diagnosis not present

## 2016-02-09 DIAGNOSIS — S82841A Displaced bimalleolar fracture of right lower leg, initial encounter for closed fracture: Secondary | ICD-10-CM | POA: Diagnosis not present

## 2016-02-10 ENCOUNTER — Ambulatory Visit
Admission: RE | Admit: 2016-02-10 | Discharge: 2016-02-10 | Disposition: A | Discharge status: Home / Self Care | Payer: Medicare Other | Source: Ambulatory Visit | Attending: Radiation Oncology | Admitting: Radiation Oncology

## 2016-02-10 DIAGNOSIS — Z5181 Encounter for therapeutic drug level monitoring: Secondary | ICD-10-CM | POA: Diagnosis not present

## 2016-02-10 DIAGNOSIS — Z510 Encounter for antineoplastic radiation therapy: Secondary | ICD-10-CM | POA: Diagnosis not present

## 2016-02-10 DIAGNOSIS — R918 Other nonspecific abnormal finding of lung field: Secondary | ICD-10-CM | POA: Diagnosis not present

## 2016-02-10 DIAGNOSIS — C7931 Secondary malignant neoplasm of brain: Secondary | ICD-10-CM | POA: Diagnosis not present

## 2016-02-11 DIAGNOSIS — Z5181 Encounter for therapeutic drug level monitoring: Secondary | ICD-10-CM | POA: Diagnosis not present

## 2016-02-12 DIAGNOSIS — Z5181 Encounter for therapeutic drug level monitoring: Secondary | ICD-10-CM | POA: Diagnosis not present

## 2016-02-15 DIAGNOSIS — Z5181 Encounter for therapeutic drug level monitoring: Secondary | ICD-10-CM | POA: Diagnosis not present

## 2016-02-16 DIAGNOSIS — M25571 Pain in right ankle and joints of right foot: Secondary | ICD-10-CM | POA: Diagnosis not present

## 2016-02-16 DIAGNOSIS — Z967 Presence of other bone and tendon implants: Secondary | ICD-10-CM | POA: Diagnosis not present

## 2016-02-16 DIAGNOSIS — Z8781 Personal history of (healed) traumatic fracture: Secondary | ICD-10-CM | POA: Diagnosis not present

## 2016-02-17 ENCOUNTER — Other Ambulatory Visit: Payer: Self-pay | Admitting: *Deleted

## 2016-02-17 DIAGNOSIS — S31819A Unspecified open wound of right buttock, initial encounter: Secondary | ICD-10-CM | POA: Diagnosis not present

## 2016-02-17 DIAGNOSIS — C7931 Secondary malignant neoplasm of brain: Secondary | ICD-10-CM

## 2016-02-18 DIAGNOSIS — C7931 Secondary malignant neoplasm of brain: Secondary | ICD-10-CM | POA: Diagnosis not present

## 2016-02-18 DIAGNOSIS — Z510 Encounter for antineoplastic radiation therapy: Secondary | ICD-10-CM | POA: Diagnosis not present

## 2016-02-18 DIAGNOSIS — R918 Other nonspecific abnormal finding of lung field: Secondary | ICD-10-CM | POA: Diagnosis not present

## 2016-02-21 DEATH — deceased

## 2016-02-22 ENCOUNTER — Encounter: Payer: Self-pay | Admitting: *Deleted

## 2016-02-23 ENCOUNTER — Ambulatory Visit: Admission: RE | Admit: 2016-02-23 | Payer: Medicare Other | Source: Ambulatory Visit | Admitting: Gastroenterology

## 2016-02-23 ENCOUNTER — Ambulatory Visit: Payer: Medicare Other | Admitting: Radiation Oncology

## 2016-02-23 HISTORY — DX: Other specified health status: Z78.9

## 2016-02-23 SURGERY — ESOPHAGOGASTRODUODENOSCOPY (EGD) WITH PROPOFOL
Anesthesia: General

## 2016-02-24 ENCOUNTER — Ambulatory Visit: Payer: Medicare Other

## 2016-02-25 ENCOUNTER — Ambulatory Visit: Payer: Medicare Other

## 2016-02-26 ENCOUNTER — Ambulatory Visit: Payer: Medicare Other

## 2016-02-29 ENCOUNTER — Ambulatory Visit: Payer: Medicare Other

## 2016-03-01 ENCOUNTER — Ambulatory Visit: Payer: Medicare Other

## 2016-03-01 ENCOUNTER — Other Ambulatory Visit: Payer: Medicare Other

## 2016-03-02 ENCOUNTER — Ambulatory Visit: Payer: Medicare Other

## 2016-03-03 ENCOUNTER — Ambulatory Visit: Payer: Medicare Other

## 2016-03-04 ENCOUNTER — Ambulatory Visit: Payer: Medicare Other

## 2016-03-07 ENCOUNTER — Ambulatory Visit: Payer: Medicare Other

## 2016-03-20 NOTE — Discharge Summary (Signed)
Physician Discharge Summary  Patient ID: Alexis Buck MRN: [id number] DOB/AGE: 12-04-1940 75 y.o.  Admit date: 02/02/2016 Discharge date: 03/20/2016  Admission Diagnoses:  Renal insufficiency [N28.9] Fall, initial encounter [W19.XXXA] Subluxation of ankle joint, right, initial encounter [S93.01XA] Bimalleolar fracture of right ankle, closed, initial encounter [X51.700F]   Discharge Diagnoses: Patient Active Problem List   Diagnosis Date Noted  . Acute on chronic respiratory failure with hypoxia ([place]) 02/05/2016  . Acute diastolic CHF (congestive heart failure) ([place]) 02/05/2016  . Lung cancer ([place]) 02/05/2016  . General weakness 02/05/2016  . Stool incontinence 02/05/2016  . Urine incontinence 02/05/2016  . Pressure ulcer 02/04/2016  . Displaced bimalleolar fracture of right ankle 02/02/2016  . Hematemesis 01/30/2016  . Elevated troponin I level 01/30/2016  . Acute renal failure superimposed on stage 3 chronic kidney disease ([place]) 01/06/2016  . Brain tumor ([place])   . Essential hypertension 01/01/2016  . Lung mass 01/01/2016  . Brain metastasis ([place]) 01/01/2016  . Kidney disease 01/01/2016  . Hypokalemia 01/01/2016  . Hypercalcemia 01/01/2016    Past Medical History:  Diagnosis Date  . Benign neoplasm of breast 2009  . Brain tumor ([place])   . Cancer ([place])   . Hypertension   . Medical history non-contributory      Transfusion: none   Consultants (if any): Treatment Team:  Hillary Bow, MD  Discharged Condition: Improved  Hospital Course: Alexis Buck is an 75 y.o. female who was admitted 02/02/2016 with a diagnosis of Displaced bimalleolar fracture of right ankle and went to the operating room on 02/02/2016 and underwent the above named procedures.    Surgeries: Procedure(s): OPEN REDUCTION INTERNAL FIXATION (ORIF) DISTAL ANKLE FRACTURE WITH SENODESMOSIS REPAIR on 02/02/2016 Patient tolerated the surgery well. Taken to PACU where she was stabilized and then  transferred to the orthopedic floor.  No evidence of DVT. Negative [place]. Physical therapy started on day #1 for gait training and transfer. OT started day #1 for ADL and assisted devices.  Patient's foley was d/c on day #1. Patient's IV was d/c on day #2.  On post op day #3 patient was stable and ready for discharge to home with HHPT.  Implants: : Biomet composite plate distal fibula with multiple screws and tight rope  She was given perioperative antibiotics:  Anti-infectives    Start     Dose/Rate Route Frequency Ordered Stop   02/02/16 2000  ceFAZolin (ANCEF) IVPB 2g/100 mL premix     2 g 200 mL/hr over 30 Minutes Intravenous Every 6 hours 02/02/16 1656 02/03/16 1344   02/02/16 1145  ceFAZolin (ANCEF) IVPB 2g/100 mL premix     2 g 200 mL/hr over 30 Minutes Intravenous On call to O.R. 02/02/16 1139 02/02/16 1433    .  She was given sequential compression devices, early ambulation.  She benefited maximally from the hospital stay and there were no complications.    Recent vital signs:  Vitals:   02/05/16 0502 02/05/16 0821  BP: (!) 157/89 (!) 121/98  Pulse: 79 71  Resp: 20 18  Temp: 98.2 F (36.8 C) 98 F (36.7 C)    Recent laboratory studies:  Lab Results  Component Value Date   HGB 13.0 02/02/2016   HGB 13.6 01/31/2016   HGB 12.4 01/30/2016   Lab Results  Component Value Date   WBC 12.1 (H) 02/02/2016   PLT 192 02/02/2016   Lab Results  Component Value Date   INR 0.98 01/30/2016   Lab Results  Component Value  Date   NA 141 02/05/2016   K 3.3 (L) 02/05/2016   CL 96 (L) 02/05/2016   CO2 38 (H) 02/05/2016   BUN 17 02/05/2016   CREATININE 0.80 02/05/2016   GLUCOSE 122 (H) 02/05/2016    Discharge Medications:     Medication List    TAKE these medications   aspirin EC 81 MG tablet Take 81 mg by mouth daily.   busPIRone 7.5 MG tablet Commonly known as:  BUSPAR Take 7.5 mg by mouth 2 (two) times daily.   calcium carbonate 500 MG chewable  tablet Commonly known as:  TUMS - dosed in mg elemental calcium Chew 2 tablets by mouth every 4 (four) hours as needed for indigestion or heartburn.   carvedilol 12.5 MG tablet Commonly known as:  COREG Take 12.5 mg by mouth 2 (two) times daily.   citalopram 40 MG tablet Commonly known as:  CELEXA Take 40 mg by mouth daily.   dexamethasone 4 MG tablet Commonly known as:  DECADRON Take 1 tablet (4 mg total) by mouth every 8 (eight) hours.   HYDROcodone-acetaminophen 5-325 MG tablet Commonly known as:  NORCO/VICODIN Take 1-2 tablets by mouth every 4 (four) hours as needed for moderate pain.   multivitamin with minerals Tabs tablet Take 1 tablet by mouth daily.   oxyCODONE 5 MG immediate release tablet Commonly known as:  Oxy IR/ROXICODONE Take 1-2 tablets (5-10 mg total) by mouth every 3 (three) hours as needed for breakthrough pain.   pantoprazole 40 MG tablet Commonly known as:  PROTONIX '40mg'$  BID for 2 weeks, then '40mg'$  daily   potassium & sodium phosphates 280-160-250 MG Pack Commonly known as:  PHOS-NAK Take 1 packet by mouth 4 (four) times daily -  with meals and at bedtime.   QUEtiapine 50 MG tablet Commonly known as:  SEROQUEL Take 1 tablet (50 mg total) by mouth at bedtime. What changed:  when to take this  additional instructions   risperiDONE 0.5 MG tablet Commonly known as:  RISPERDAL Take 0.5 mg by mouth at bedtime.   simvastatin 20 MG tablet Commonly known as:  ZOCOR Take 20 mg by mouth every evening.       Diagnostic Studies: No results found.  Disposition: 01-Home or Self Care  Discharge Instructions    Diet - low sodium heart healthy    Complete by:  As directed    Increase activity slowly    Complete by:  As directed        Contact information for follow-up providers    [facility] S., MD Follow up on 02/09/2016.   Specialty:  Radiation Oncology Why:  Appointment is at 11:00 Contact information: [place] [address] [phone]        MENZ,MICHAEL, MD Follow up on 02/16/2016.   Specialty:  Orthopedic Surgery Why:  For cast and staple removal Contact information: [place] [address] [phone]            Contact information for after-discharge care    Destination    [facility] .   Specialty:  [place] information: [address] [place] [phone]                   Signed: Feliberto Gottron 03/20/2016, 10:13 AM

## 2018-02-05 IMAGING — CR DG ANKLE COMPLETE 3+V*R*
3 series · 3 of 3 positions shown · non-contrast
Comparison: None.

CLINICAL DATA: Fell today

EXAM:
RIGHT ANKLE - COMPLETE 3+ VIEW

[ankle ap]
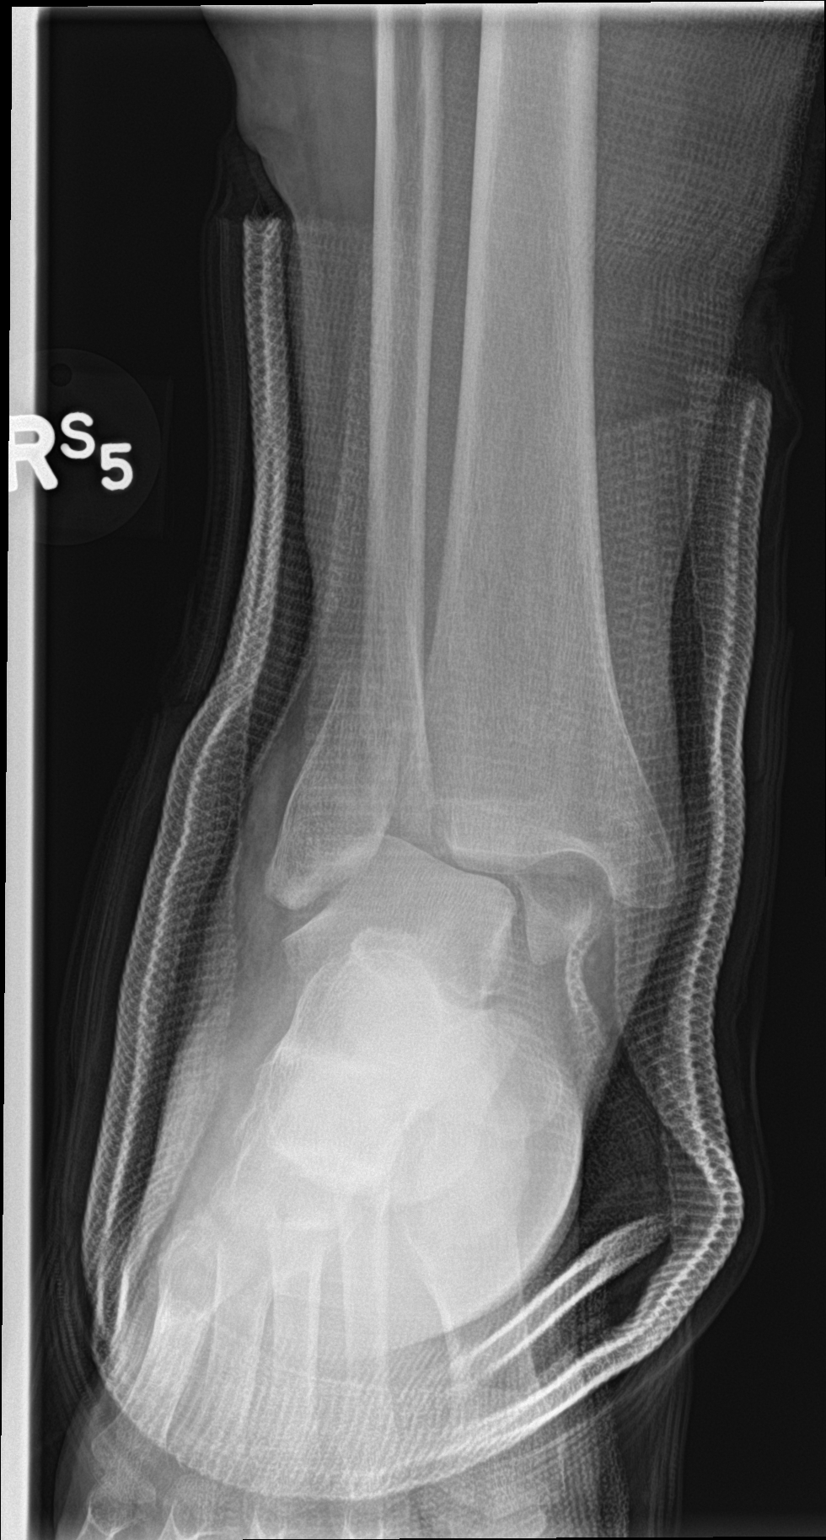

[ankle obl]
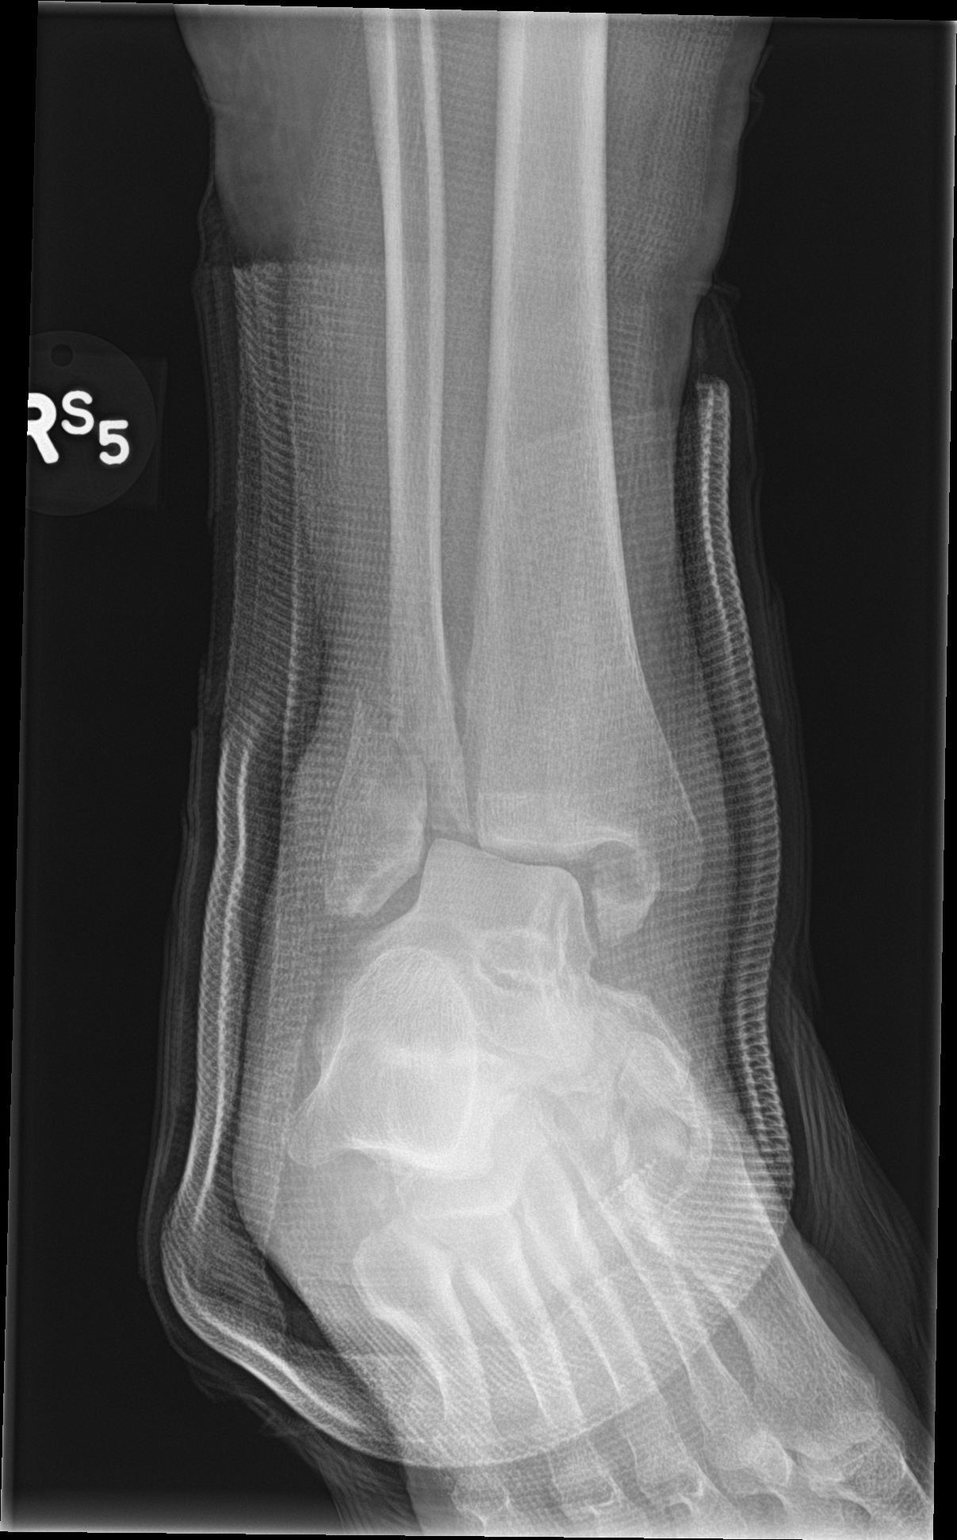

[ankle lat]
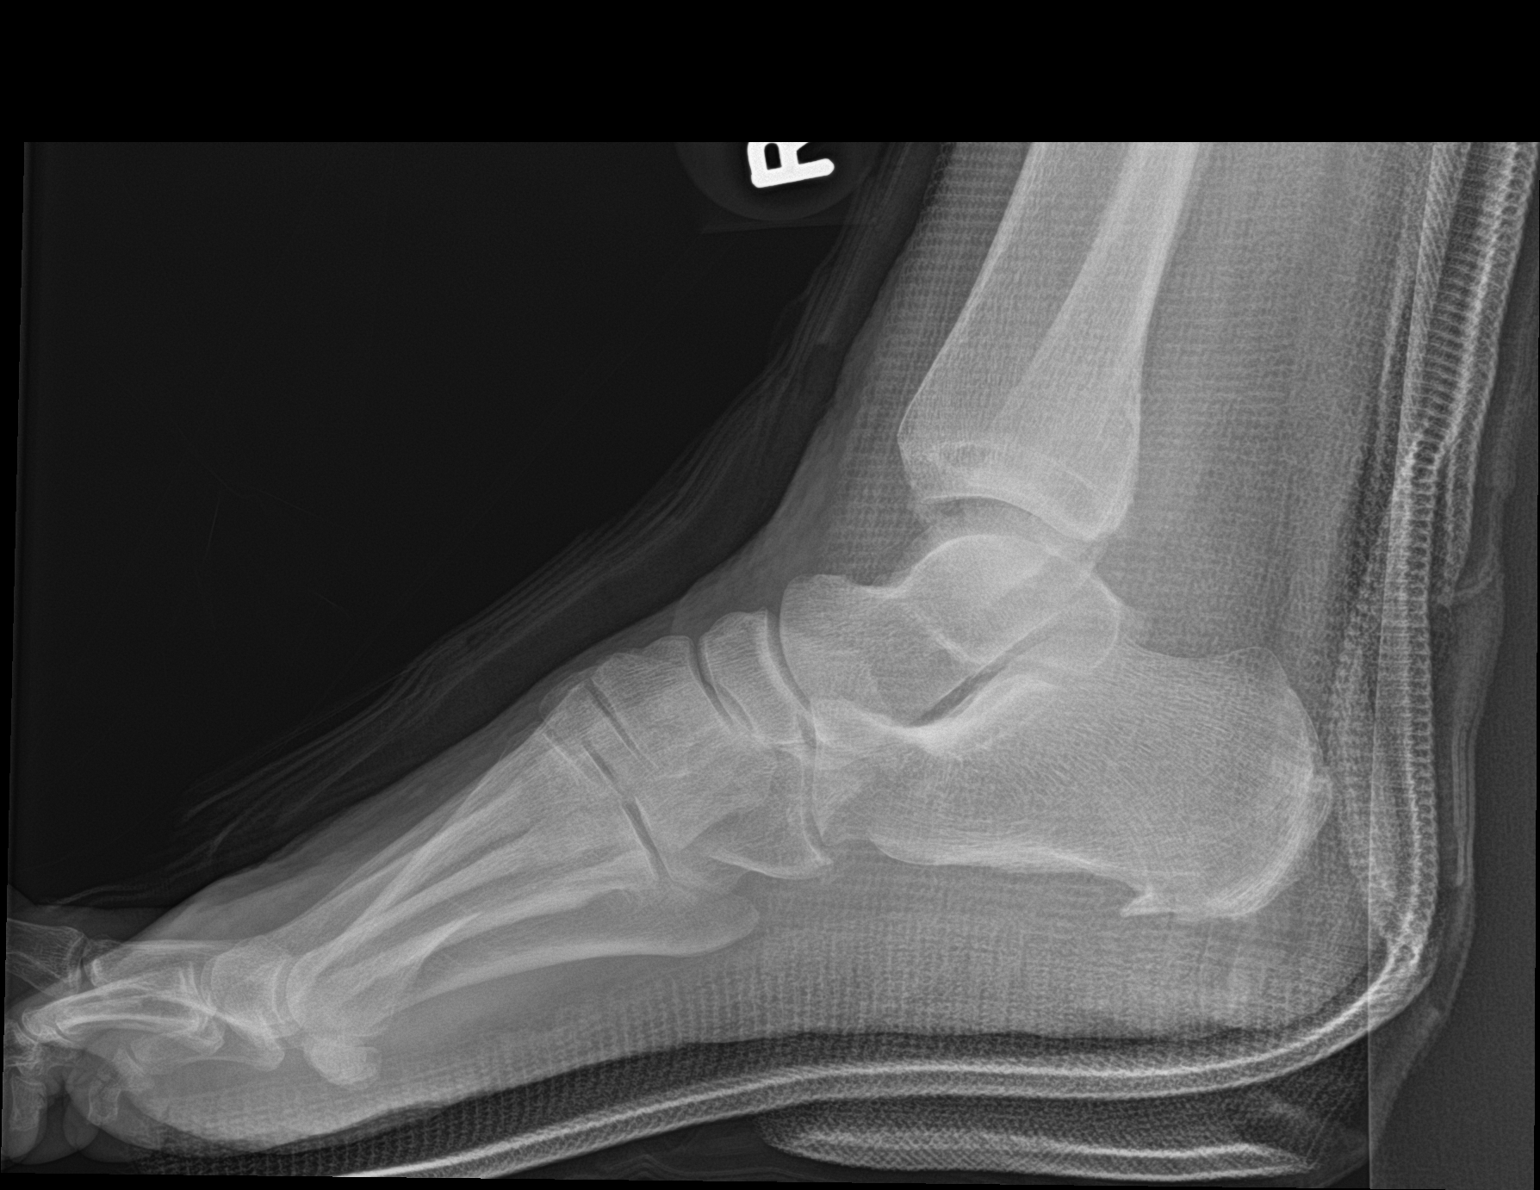

[3 of 3 positions shown; findings below may reference images not displayed]

FINDINGS: There is a displaced fracture of the medial malleolus with the
distal fragment displaced medially. Also there is tibiotalar
subluxation with the talus lateral relative to the distal right
tibia. An oblique displaced fracture of the distal fibula also is
noted. On the lateral view the posterior malleolus appears intact. A
degenerative calcaneal spur is noted.
IMPRESSION: 1. Bimalleolar right ankle fractures involving the medial and
lateral malleoli.
2. Tibiotalar subluxation.

## 2018-02-05 IMAGING — DX DG ANKLE 2V *R*
1 series · 1 of 1 positions shown · non-contrast
Comparison: 02/02/2016.

CLINICAL DATA: 74-year-old female post ankle surgery. Subsequent
encounter.

EXAM:
RIGHT ANKLE - 2 VIEW

[ankle ap]
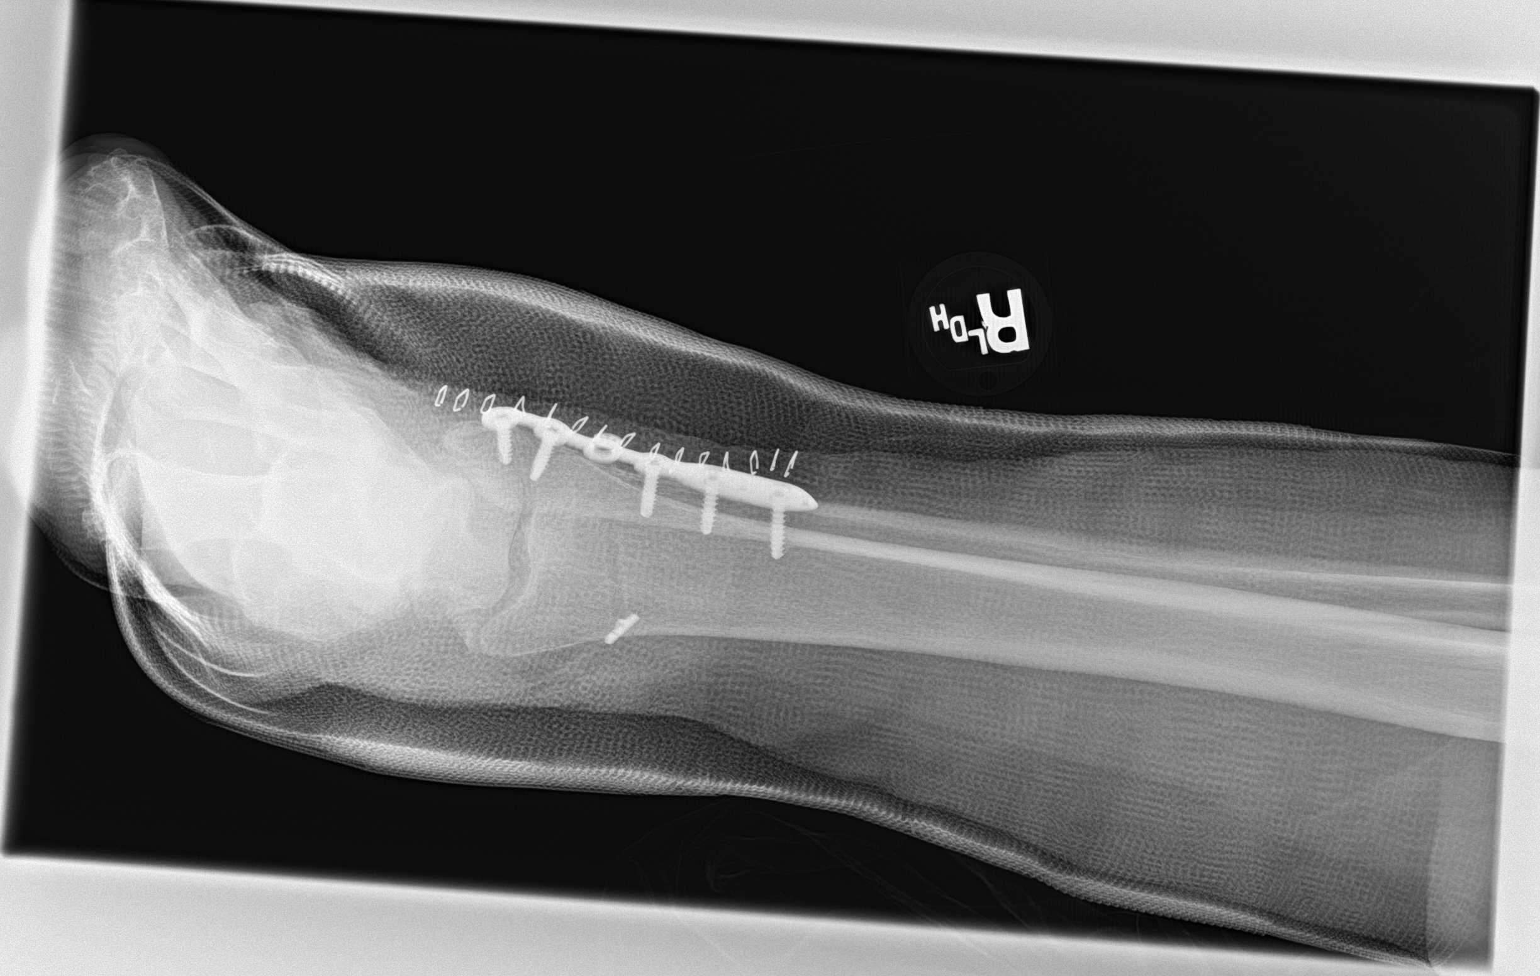

[1 of 1 positions shown; findings below may reference images not displayed]

FINDINGS: Overlying cast obscures fine osseous and soft tissue detail.

Post open reduction and internal fixation of right fibula fracture
with side plate and screws.

Reduction of distal tibia -fibula separation with surgical stay
placed between the fibula sideplate and tibia.

Better alignment of medial malleolar fracture and ankle mortise.
IMPRESSION: Open reduction and internal fixation right ankle fracture as noted
above.

## 2018-02-06 IMAGING — DX DG CHEST 1V PORT
1 series · 1 of 1 positions shown · non-contrast
Comparison: Portable chest x-ray February 02, 2016

CLINICAL DATA: Hypoxia, history of brain and breast malignancy,
hypertension, acute bimalleolar right ankle fracture status post
ORIF yesterday.

EXAM:
PORTABLE CHEST 1 VIEW

[chest ap]
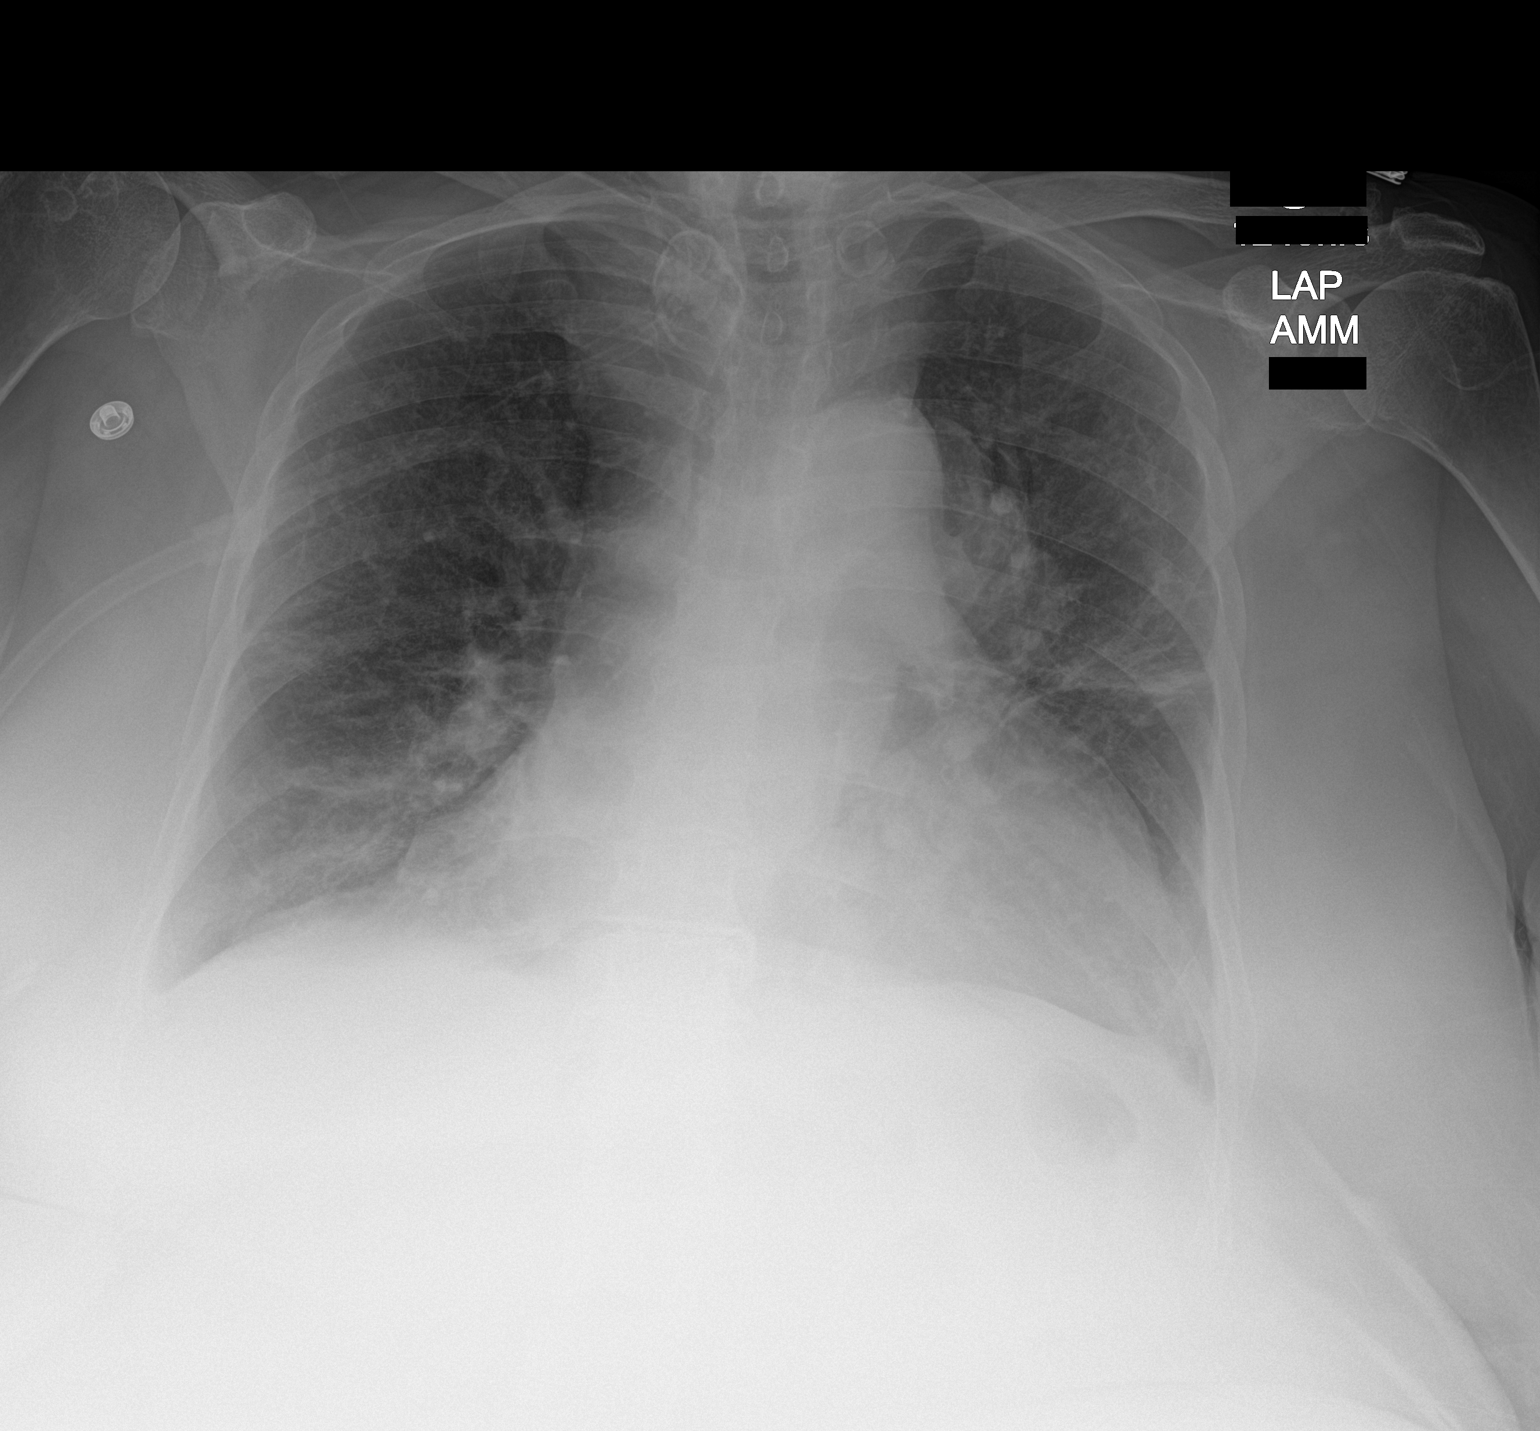

[1 of 1 positions shown; findings below may reference images not displayed]

FINDINGS: The lungs are reasonably well inflated. The interstitial markings
are slightly less conspicuous. There remain coarse lung markings in
the right lower lung and left perihilar region. An AP window mass is
again demonstrated. There is calcification in the wall of the aortic
arch. There is cardiomegaly. There are bilateral calcified upper
paratracheal lymph nodes.
IMPRESSION: Cardiomegaly with mild central pulmonary vascular prominence with
decreased interstitial edema. Subsegmental atelectasis or scarring
in the left perihilar and right infrahilar regions. No pleural
effusion or pneumothorax.

Large right AP window region mass. Calcified upper paratracheal
lymph nodes.
